# Patient Record
Sex: Female | Born: 1960 | Race: White | Hispanic: No | Smoking: Former smoker
Health system: Southern US, Community
[De-identification: ages and names within clinical notes are randomized; demographics above are authoritative.]

## PROBLEM LIST (undated history)

## (undated) DIAGNOSIS — E039 Hypothyroidism, unspecified: Secondary | ICD-10-CM

## (undated) DIAGNOSIS — T7840XA Allergy, unspecified, initial encounter: Secondary | ICD-10-CM

## (undated) DIAGNOSIS — M810 Age-related osteoporosis without current pathological fracture: Secondary | ICD-10-CM

## (undated) DIAGNOSIS — R499 Unspecified voice and resonance disorder: Secondary | ICD-10-CM

## (undated) DIAGNOSIS — I10 Essential (primary) hypertension: Secondary | ICD-10-CM

## (undated) DIAGNOSIS — F329 Major depressive disorder, single episode, unspecified: Secondary | ICD-10-CM

## (undated) DIAGNOSIS — I872 Venous insufficiency (chronic) (peripheral): Secondary | ICD-10-CM

## (undated) DIAGNOSIS — Z95828 Presence of other vascular implants and grafts: Secondary | ICD-10-CM

## (undated) DIAGNOSIS — G9050 Complex regional pain syndrome I, unspecified: Secondary | ICD-10-CM

## (undated) DIAGNOSIS — G894 Chronic pain syndrome: Secondary | ICD-10-CM

## (undated) DIAGNOSIS — R52 Pain, unspecified: Secondary | ICD-10-CM

## (undated) DIAGNOSIS — R609 Edema, unspecified: Secondary | ICD-10-CM

## (undated) DIAGNOSIS — F32A Depression, unspecified: Secondary | ICD-10-CM

## (undated) DIAGNOSIS — J301 Allergic rhinitis due to pollen: Secondary | ICD-10-CM

## (undated) DIAGNOSIS — D649 Anemia, unspecified: Secondary | ICD-10-CM

## (undated) DIAGNOSIS — R0602 Shortness of breath: Secondary | ICD-10-CM

## (undated) DIAGNOSIS — J189 Pneumonia, unspecified organism: Secondary | ICD-10-CM

## (undated) DIAGNOSIS — K5909 Other constipation: Secondary | ICD-10-CM

## (undated) DIAGNOSIS — F419 Anxiety disorder, unspecified: Secondary | ICD-10-CM

## (undated) DIAGNOSIS — M25473 Effusion, unspecified ankle: Secondary | ICD-10-CM

## (undated) DIAGNOSIS — T884XXA Failed or difficult intubation, initial encounter: Secondary | ICD-10-CM

## (undated) DIAGNOSIS — Z9109 Other allergy status, other than to drugs and biological substances: Secondary | ICD-10-CM

## (undated) DIAGNOSIS — L03116 Cellulitis of left lower limb: Secondary | ICD-10-CM

## (undated) DIAGNOSIS — M1990 Unspecified osteoarthritis, unspecified site: Secondary | ICD-10-CM

## (undated) HISTORY — PX: ORTHOPEDIC SURGERY: SHX850

## (undated) HISTORY — PX: HIP ARTHROPLASTY: SHX981

## (undated) HISTORY — DX: Allergic rhinitis due to pollen: J30.1

## (undated) HISTORY — DX: Cellulitis of left lower limb: L03.116

## (undated) HISTORY — DX: Allergy, unspecified, initial encounter: T78.40XA

## (undated) HISTORY — DX: Unspecified voice and resonance disorder: R49.9

## (undated) HISTORY — PX: SPINE SURGERY: SHX786

## (undated) HISTORY — PX: OTHER SURGICAL HISTORY: SHX169

## (undated) HISTORY — DX: Edema, unspecified: R60.9

## (undated) HISTORY — DX: Pain, unspecified: R52

## (undated) HISTORY — PX: FRACTURE SURGERY: SHX138

## (undated) HISTORY — DX: Anemia, unspecified: D64.9

## (undated) HISTORY — DX: Hypothyroidism, unspecified: E03.9

## (undated) HISTORY — DX: Unspecified osteoarthritis, unspecified site: M19.90

## (undated) HISTORY — DX: Shortness of breath: R06.02

## (undated) HISTORY — DX: Anxiety disorder, unspecified: F41.9

## (undated) HISTORY — PX: ANKLE ARTHROPLASTY: SUR68

## (undated) HISTORY — DX: Venous insufficiency (chronic) (peripheral): I87.2

## (undated) HISTORY — PX: LARYNX SURGERY: SHX692

## (undated) HISTORY — DX: Age-related osteoporosis without current pathological fracture: M81.0

## (undated) HISTORY — DX: Chronic pain syndrome: G89.4

## (undated) HISTORY — PX: PEG TUBE PLACEMENT: SUR1034

---

## 2007-10-05 DIAGNOSIS — Z95828 Presence of other vascular implants and grafts: Secondary | ICD-10-CM

## 2007-10-05 HISTORY — DX: Presence of other vascular implants and grafts: Z95.828

## 2007-10-05 HISTORY — PX: PEG TUBE PLACEMENT: SUR1034

## 2007-10-05 HISTORY — PX: IVC FILTER INSERTION: CATH118245

## 2008-04-15 ENCOUNTER — Inpatient Hospital Stay (HOSPITAL_COMMUNITY)
Admission: RE | Admit: 2008-04-15 | Discharge: 2008-04-23 | Payer: Self-pay | Admitting: Physical Medicine & Rehabilitation

## 2008-04-15 ENCOUNTER — Ambulatory Visit: Payer: Self-pay | Admitting: Physical Medicine & Rehabilitation

## 2008-05-16 ENCOUNTER — Ambulatory Visit (HOSPITAL_COMMUNITY): Admission: RE | Admit: 2008-05-16 | Discharge: 2008-05-16 | Payer: Self-pay | Admitting: Pulmonary Disease

## 2008-05-22 ENCOUNTER — Emergency Department (HOSPITAL_COMMUNITY): Admission: EM | Admit: 2008-05-22 | Discharge: 2008-05-22 | Payer: Self-pay | Admitting: Emergency Medicine

## 2008-06-24 ENCOUNTER — Ambulatory Visit (HOSPITAL_COMMUNITY)
Admission: RE | Admit: 2008-06-24 | Discharge: 2008-06-24 | Payer: Self-pay | Admitting: Physical Medicine & Rehabilitation

## 2008-08-12 ENCOUNTER — Ambulatory Visit (HOSPITAL_COMMUNITY): Admission: RE | Admit: 2008-08-12 | Discharge: 2008-08-12 | Payer: Self-pay | Admitting: Pulmonary Disease

## 2008-09-23 ENCOUNTER — Ambulatory Visit (HOSPITAL_COMMUNITY): Admission: RE | Admit: 2008-09-23 | Discharge: 2008-09-23 | Payer: Self-pay | Admitting: Pulmonary Disease

## 2008-09-25 ENCOUNTER — Ambulatory Visit (HOSPITAL_COMMUNITY): Admission: RE | Admit: 2008-09-25 | Discharge: 2008-09-25 | Payer: Self-pay | Admitting: Pulmonary Disease

## 2008-10-04 HISTORY — PX: FIBULA HARDWARE REMOVAL: SHX1629

## 2008-10-04 HISTORY — PX: KNEE ARTHROPLASTY: SHX992

## 2008-11-21 ENCOUNTER — Ambulatory Visit: Payer: Self-pay | Admitting: Internal Medicine

## 2008-11-26 ENCOUNTER — Telehealth: Payer: Self-pay | Admitting: Urgent Care

## 2009-01-16 ENCOUNTER — Telehealth (INDEPENDENT_AMBULATORY_CARE_PROVIDER_SITE_OTHER): Payer: Self-pay

## 2009-01-27 ENCOUNTER — Ambulatory Visit (HOSPITAL_COMMUNITY): Admission: RE | Admit: 2009-01-27 | Discharge: 2009-01-27 | Payer: Self-pay | Admitting: Pulmonary Disease

## 2009-01-28 DIAGNOSIS — R1319 Other dysphagia: Secondary | ICD-10-CM | POA: Insufficient documentation

## 2009-01-28 DIAGNOSIS — M25569 Pain in unspecified knee: Secondary | ICD-10-CM

## 2009-01-28 DIAGNOSIS — F3289 Other specified depressive episodes: Secondary | ICD-10-CM | POA: Insufficient documentation

## 2009-01-28 DIAGNOSIS — R0600 Dyspnea, unspecified: Secondary | ICD-10-CM

## 2009-01-28 DIAGNOSIS — F329 Major depressive disorder, single episode, unspecified: Secondary | ICD-10-CM

## 2009-01-28 DIAGNOSIS — E039 Hypothyroidism, unspecified: Secondary | ICD-10-CM | POA: Insufficient documentation

## 2009-01-28 DIAGNOSIS — R0602 Shortness of breath: Secondary | ICD-10-CM | POA: Insufficient documentation

## 2009-01-28 DIAGNOSIS — R0609 Other forms of dyspnea: Secondary | ICD-10-CM

## 2009-01-28 DIAGNOSIS — M79606 Pain in leg, unspecified: Secondary | ICD-10-CM | POA: Insufficient documentation

## 2009-01-28 HISTORY — DX: Other forms of dyspnea: R06.09

## 2009-01-28 HISTORY — DX: Dyspnea, unspecified: R06.00

## 2009-01-29 ENCOUNTER — Ambulatory Visit: Payer: Self-pay | Admitting: Internal Medicine

## 2009-01-30 ENCOUNTER — Encounter: Payer: Self-pay | Admitting: Internal Medicine

## 2009-01-31 ENCOUNTER — Ambulatory Visit: Payer: Self-pay | Admitting: Internal Medicine

## 2009-01-31 ENCOUNTER — Ambulatory Visit (HOSPITAL_COMMUNITY): Admission: RE | Admit: 2009-01-31 | Discharge: 2009-01-31 | Payer: Self-pay | Admitting: Internal Medicine

## 2009-02-03 ENCOUNTER — Encounter: Payer: Self-pay | Admitting: Internal Medicine

## 2009-02-19 ENCOUNTER — Ambulatory Visit (HOSPITAL_COMMUNITY): Admission: RE | Admit: 2009-02-19 | Discharge: 2009-02-19 | Payer: Self-pay | Admitting: Orthopedic Surgery

## 2009-04-01 ENCOUNTER — Ambulatory Visit (HOSPITAL_COMMUNITY): Admission: RE | Admit: 2009-04-01 | Discharge: 2009-04-01 | Payer: Self-pay | Admitting: Pulmonary Disease

## 2009-06-23 ENCOUNTER — Ambulatory Visit: Payer: Self-pay | Admitting: Internal Medicine

## 2009-06-23 DIAGNOSIS — K5900 Constipation, unspecified: Secondary | ICD-10-CM | POA: Insufficient documentation

## 2009-08-11 ENCOUNTER — Ambulatory Visit (HOSPITAL_COMMUNITY): Admission: RE | Admit: 2009-08-11 | Discharge: 2009-08-12 | Payer: Self-pay | Admitting: Orthopedic Surgery

## 2009-09-03 HISTORY — PX: JOINT REPLACEMENT: SHX530

## 2009-09-08 ENCOUNTER — Inpatient Hospital Stay (HOSPITAL_COMMUNITY): Admission: RE | Admit: 2009-09-08 | Discharge: 2009-09-10 | Payer: Self-pay | Admitting: Orthopedic Surgery

## 2009-12-23 ENCOUNTER — Ambulatory Visit (HOSPITAL_COMMUNITY): Admission: RE | Admit: 2009-12-23 | Discharge: 2009-12-23 | Payer: Self-pay | Admitting: Pulmonary Disease

## 2010-01-19 ENCOUNTER — Ambulatory Visit (HOSPITAL_BASED_OUTPATIENT_CLINIC_OR_DEPARTMENT_OTHER): Admission: RE | Admit: 2010-01-19 | Discharge: 2010-01-20 | Payer: Self-pay | Admitting: Orthopedic Surgery

## 2010-03-16 ENCOUNTER — Encounter: Payer: Self-pay | Admitting: Internal Medicine

## 2010-03-17 ENCOUNTER — Encounter: Payer: Self-pay | Admitting: Internal Medicine

## 2010-03-17 ENCOUNTER — Telehealth (INDEPENDENT_AMBULATORY_CARE_PROVIDER_SITE_OTHER): Payer: Self-pay

## 2010-03-25 ENCOUNTER — Other Ambulatory Visit: Admission: RE | Admit: 2010-03-25 | Discharge: 2010-03-25 | Payer: Self-pay | Admitting: Obstetrics & Gynecology

## 2010-03-26 ENCOUNTER — Ambulatory Visit (HOSPITAL_COMMUNITY): Admission: RE | Admit: 2010-03-26 | Discharge: 2010-03-26 | Payer: Self-pay | Admitting: Obstetrics & Gynecology

## 2010-04-10 ENCOUNTER — Ambulatory Visit (HOSPITAL_COMMUNITY): Admission: RE | Admit: 2010-04-10 | Discharge: 2010-04-10 | Payer: Self-pay | Admitting: Internal Medicine

## 2010-04-10 ENCOUNTER — Ambulatory Visit: Payer: Self-pay | Admitting: Internal Medicine

## 2010-06-05 ENCOUNTER — Ambulatory Visit (HOSPITAL_COMMUNITY)
Admission: RE | Admit: 2010-06-05 | Discharge: 2010-06-05 | Payer: Self-pay | Source: Home / Self Care | Admitting: Pulmonary Disease

## 2010-06-15 ENCOUNTER — Ambulatory Visit (HOSPITAL_COMMUNITY): Admission: RE | Admit: 2010-06-15 | Discharge: 2010-06-15 | Payer: Self-pay | Admitting: Pulmonary Disease

## 2010-09-03 ENCOUNTER — Ambulatory Visit (HOSPITAL_COMMUNITY): Admission: RE | Admit: 2010-09-03 | Discharge: 2010-09-03 | Payer: Self-pay | Admitting: Pulmonary Disease

## 2010-10-04 HISTORY — PX: ANKLE HARDWARE REMOVAL: SHX1149

## 2010-10-30 ENCOUNTER — Ambulatory Visit (HOSPITAL_COMMUNITY)
Admission: RE | Admit: 2010-10-30 | Discharge: 2010-10-30 | Payer: Self-pay | Source: Home / Self Care | Attending: Pulmonary Disease | Admitting: Pulmonary Disease

## 2010-11-05 NOTE — Progress Notes (Signed)
----   Converted from flag ---- ---- 03/17/2010 8:54 AM, Jonathon Bellows MD, Caleen Essex wrote: no; not for tcs  ---- 03/16/2010 4:04 PM, Cloria Spring LPN wrote: Dr. Jena Gauss, please review triage. Pt had left knee replacement in 09/2009.  Is required to have antibiotics for dental procedures. Will she need antibiotics for the TCS? ------------------------------

## 2010-11-05 NOTE — Letter (Signed)
Summary: TRIAGE ORDER  TRIAGE ORDER   Imported By: Ave Filter 03/17/2010 12:19:53  _____________________________________________________________________  External Attachment:    Type:   Image     Comment:   External Document

## 2010-11-05 NOTE — Letter (Signed)
Summary: TRIAGE ORDER  TRIAGE ORDER   Imported By: Ave Filter 03/16/2010 14:03:18  _____________________________________________________________________  External Attachment:    Type:   Image     Comment:   External Document

## 2011-01-05 LAB — BASIC METABOLIC PANEL
BUN: 5 mg/dL — ABNORMAL LOW (ref 6–23)
BUN: 9 mg/dL (ref 6–23)
CO2: 27 mEq/L (ref 19–32)
CO2: 29 mEq/L (ref 19–32)
Calcium: 8.3 mg/dL — ABNORMAL LOW (ref 8.4–10.5)
Chloride: 102 mEq/L (ref 96–112)
Chloride: 99 mEq/L (ref 96–112)
Creatinine, Ser: 0.71 mg/dL (ref 0.4–1.2)
GFR calc non Af Amer: >60 mL/min (ref >60)
Glucose, Bld: 112 mg/dL — ABNORMAL HIGH (ref 70–99)
Glucose, Bld: 135 mg/dL — ABNORMAL HIGH (ref 70–99)
Potassium: 4.1 mEq/L (ref 3.5–5.1)
Sodium: 131 mEq/L — ABNORMAL LOW (ref 135–145)

## 2011-01-05 LAB — CBC
HCT: 24.8 % — ABNORMAL LOW (ref 36.0–46.0)
Hemoglobin: 13.9 g/dL (ref 12.0–15.0)
Hemoglobin: 8.5 g/dL — ABNORMAL LOW (ref 12.0–15.0)
MCHC: 34 g/dL (ref 30.0–36.0)
MCHC: 34.3 g/dL (ref 30.0–36.0)
MCHC: 34.3 g/dL (ref 30.0–36.0)
MCV: 87.1 fL (ref 78.0–100.0)
MCV: 87.4 fL (ref 78.0–100.0)
Platelets: 100 10*3/uL — ABNORMAL LOW (ref 150–400)
RBC: 4.65 MIL/uL (ref 3.87–5.11)
RDW: 12.2 % (ref 11.5–15.5)
RDW: 12.4 % (ref 11.5–15.5)

## 2011-01-05 LAB — URINALYSIS, ROUTINE W REFLEX MICROSCOPIC
Glucose, UA: NEGATIVE mg/dL
Ketones, ur: NEGATIVE mg/dL
Protein, ur: NEGATIVE mg/dL
Urobilinogen, UA: 0.2 mg/dL (ref 0.0–1.0)

## 2011-01-05 LAB — DIFFERENTIAL
Eosinophils Absolute: 0.2 10*3/uL (ref 0.0–0.7)
Eosinophils Relative: 5 % (ref 0–5)
Lymphs Abs: 2.1 10*3/uL (ref 0.7–4.0)

## 2011-01-05 LAB — TYPE AND SCREEN: ABO/RH(D): B POS

## 2011-01-06 LAB — BASIC METABOLIC PANEL
BUN: 9 mg/dL (ref 6–23)
Calcium: 8.6 mg/dL (ref 8.4–10.5)
Calcium: 9.5 mg/dL (ref 8.4–10.5)
GFR calc Af Amer: >60 mL/min (ref >60)
GFR calc non Af Amer: >60 mL/min (ref >60)
GFR calc non Af Amer: >60 mL/min (ref >60)
Potassium: 4.2 mEq/L (ref 3.5–5.1)
Sodium: 137 mEq/L (ref 135–145)
Sodium: 139 mEq/L (ref 135–145)

## 2011-01-06 LAB — CBC
HCT: 38.5 % (ref 36.0–46.0)
Hemoglobin: 12.3 g/dL (ref 12.0–15.0)
Hemoglobin: 13.2 g/dL (ref 12.0–15.0)
Platelets: 147 10*3/uL — ABNORMAL LOW (ref 150–400)
RBC: 4.01 MIL/uL (ref 3.87–5.11)
RDW: 12.5 % (ref 11.5–15.5)
WBC: 4.6 10*3/uL (ref 4.0–10.5)
WBC: 6.9 10*3/uL (ref 4.0–10.5)

## 2011-01-06 LAB — DIFFERENTIAL
Basophils Absolute: 0 10*3/uL (ref 0.0–0.1)
Eosinophils Relative: 7 % — ABNORMAL HIGH (ref 0–5)
Lymphocytes Relative: 51 % — ABNORMAL HIGH (ref 12–46)
Lymphs Abs: 2.3 10*3/uL (ref 0.7–4.0)
Neutro Abs: 1.6 10*3/uL — ABNORMAL LOW (ref 1.7–7.7)

## 2011-01-06 LAB — APTT: aPTT: 32 seconds (ref 24–37)

## 2011-01-06 LAB — URINALYSIS, ROUTINE W REFLEX MICROSCOPIC
Bilirubin Urine: NEGATIVE
Glucose, UA: NEGATIVE mg/dL

## 2011-01-06 LAB — TYPE AND SCREEN: Antibody Screen: NEGATIVE

## 2011-01-06 LAB — PROTIME-INR: Prothrombin Time: 13.8 seconds (ref 11.6–15.2)

## 2011-01-12 LAB — COMPREHENSIVE METABOLIC PANEL
ALT: 14 U/L (ref 0–35)
CO2: 35 mEq/L — ABNORMAL HIGH (ref 19–32)
Calcium: 9.3 mg/dL (ref 8.4–10.5)
GFR calc non Af Amer: >60 mL/min (ref >60)
Glucose, Bld: 104 mg/dL — ABNORMAL HIGH (ref 70–99)
Sodium: 142 mEq/L (ref 135–145)

## 2011-01-12 LAB — CBC
Hemoglobin: 13.3 g/dL (ref 12.0–15.0)
MCHC: 34.2 g/dL (ref 30.0–36.0)
MCV: 86.1 fL (ref 78.0–100.0)
RBC: 4.51 MIL/uL (ref 3.87–5.11)
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
## 2011-02-16 NOTE — Op Note (Signed)
Stephanie Elliott, RUEDAS           ACCOUNT NO.:  [id number]   MEDICAL RECORD NO.:  [id number]          PATIENT TYPE:  AMB   LOCATION:  DAY                          FACILITY[facility]  [facility]   PHYSICIAN:  Ronald A. Gioffre, M.D.DATE OF BIRTH:  1961-01-18   DATE OF PROCEDURE:  02/19/2009  DATE OF DISCHARGE:                               OPERATIVE REPORT   ADDENDUM   OPERATION:  Also abrasion chondroplasty of the medial femoral condyle.  I also did abrasion chondroplasty of the patella.   OPERATIVE NOTE:  I did perform an abrasion chondroplasty of the medial  femoral condyle utilizing the shaver suction device.           ______________________________  Georges Lynch Darrelyn Hillock, M.D.     RAG/MEDQ  D:  02/19/2009  T:  02/19/2009  Job:  [id number]
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
## 2011-04-08 ENCOUNTER — Ambulatory Visit (HOSPITAL_COMMUNITY)
Admission: RE | Admit: 2011-04-08 | Discharge: 2011-04-08 | Disposition: A | Discharge status: Home / Self Care | Payer: Medicare Other | Source: Ambulatory Visit | Attending: Pulmonary Disease | Admitting: Pulmonary Disease

## 2011-04-08 ENCOUNTER — Other Ambulatory Visit (HOSPITAL_COMMUNITY): Payer: Self-pay | Admitting: Pulmonary Disease

## 2011-04-08 DIAGNOSIS — M25531 Pain in right wrist: Secondary | ICD-10-CM

## 2011-04-08 DIAGNOSIS — M25539 Pain in unspecified wrist: Secondary | ICD-10-CM | POA: Insufficient documentation

## 2011-07-01 LAB — URINALYSIS, ROUTINE W REFLEX MICROSCOPIC
Ketones, ur: NEGATIVE
Nitrite: NEGATIVE
Protein, ur: NEGATIVE
Urobilinogen, UA: 1
pH: 7.5

## 2011-07-01 LAB — URINE CULTURE: Colony Count: >=100000

## 2011-07-01 LAB — COMPREHENSIVE METABOLIC PANEL WITH GFR
ALT: 11
AST: 15
Albumin: 3.4 — ABNORMAL LOW
Alkaline Phosphatase: 60
BUN: 17
CO2: 33 — ABNORMAL HIGH
Calcium: 9.5
Chloride: 104
Creatinine, Ser: 0.79
GFR calc non Af Amer: >60
Glucose, Bld: 103 — ABNORMAL HIGH
Potassium: 4
Sodium: 140
Total Bilirubin: 0.6
Total Protein: 6.2

## 2011-07-01 LAB — URINE MICROSCOPIC-ADD ON

## 2011-07-01 LAB — CBC
RBC: 3.56 — ABNORMAL LOW
WBC: 4.8

## 2011-07-02 LAB — URINALYSIS, ROUTINE W REFLEX MICROSCOPIC
Hgb urine dipstick: NEGATIVE
Specific Gravity, Urine: 1.009
Urobilinogen, UA: 0.2
pH: 7.5

## 2011-07-02 LAB — URINE CULTURE

## 2011-07-02 LAB — URINE MICROSCOPIC-ADD ON

## 2011-08-20 ENCOUNTER — Other Ambulatory Visit (HOSPITAL_COMMUNITY): Payer: Self-pay | Admitting: Pulmonary Disease

## 2011-08-24 ENCOUNTER — Ambulatory Visit (HOSPITAL_COMMUNITY)
Admission: RE | Admit: 2011-08-24 | Discharge: 2011-08-24 | Disposition: A | Discharge status: Home / Self Care | Payer: Medicare Other | Source: Ambulatory Visit | Attending: Pulmonary Disease | Admitting: Pulmonary Disease

## 2011-08-24 DIAGNOSIS — R1012 Left upper quadrant pain: Secondary | ICD-10-CM | POA: Insufficient documentation

## 2011-08-24 DIAGNOSIS — D3000 Benign neoplasm of unspecified kidney: Secondary | ICD-10-CM | POA: Insufficient documentation

## 2011-08-24 MED ORDER — IOHEXOL 300 MG/ML  SOLN
100.0000 mL | Freq: Once | INTRAMUSCULAR | Status: AC | PRN
  Administered 2011-08-24: 100 mL via INTRAVENOUS

## 2011-10-06 DIAGNOSIS — R490 Dysphonia: Secondary | ICD-10-CM | POA: Insufficient documentation

## 2011-10-06 DIAGNOSIS — J3802 Paralysis of vocal cords and larynx, bilateral: Secondary | ICD-10-CM | POA: Insufficient documentation

## 2011-10-06 DIAGNOSIS — E039 Hypothyroidism, unspecified: Secondary | ICD-10-CM | POA: Insufficient documentation

## 2011-12-02 DIAGNOSIS — Z79891 Long term (current) use of opiate analgesic: Secondary | ICD-10-CM | POA: Insufficient documentation

## 2011-12-02 DIAGNOSIS — Z5181 Encounter for therapeutic drug level monitoring: Secondary | ICD-10-CM | POA: Insufficient documentation

## 2012-02-21 ENCOUNTER — Encounter: Payer: Self-pay | Admitting: Family Medicine

## 2012-04-12 ENCOUNTER — Encounter: Payer: Self-pay | Admitting: Family Medicine

## 2012-05-25 ENCOUNTER — Other Ambulatory Visit (HOSPITAL_COMMUNITY): Payer: Self-pay | Admitting: Pulmonary Disease

## 2012-05-25 DIAGNOSIS — Z139 Encounter for screening, unspecified: Secondary | ICD-10-CM

## 2012-05-30 ENCOUNTER — Ambulatory Visit (HOSPITAL_COMMUNITY)
Admission: RE | Admit: 2012-05-30 | Discharge: 2012-05-30 | Disposition: A | Discharge status: Home / Self Care | Payer: Medicare Other | Source: Ambulatory Visit | Attending: Pulmonary Disease | Admitting: Pulmonary Disease

## 2012-05-30 DIAGNOSIS — Z1231 Encounter for screening mammogram for malignant neoplasm of breast: Secondary | ICD-10-CM | POA: Insufficient documentation

## 2012-05-30 DIAGNOSIS — Z139 Encounter for screening, unspecified: Secondary | ICD-10-CM

## 2012-06-02 LAB — HM MAMMOGRAPHY

## 2012-12-08 ENCOUNTER — Emergency Department (HOSPITAL_COMMUNITY)
Admission: EM | Admit: 2012-12-08 | Discharge: 2012-12-08 | Disposition: A | Discharge status: Home / Self Care | Payer: Medicare Other | Attending: Emergency Medicine | Admitting: Emergency Medicine

## 2012-12-08 ENCOUNTER — Encounter (HOSPITAL_COMMUNITY): Payer: Self-pay | Admitting: *Deleted

## 2012-12-08 DIAGNOSIS — Z79899 Other long term (current) drug therapy: Secondary | ICD-10-CM | POA: Insufficient documentation

## 2012-12-08 DIAGNOSIS — G8921 Chronic pain due to trauma: Secondary | ICD-10-CM | POA: Insufficient documentation

## 2012-12-08 HISTORY — DX: Complex regional pain syndrome I, unspecified: G90.50

## 2012-12-08 MED ORDER — OXYCODONE-ACETAMINOPHEN 10-325 MG PO TABS: 1.0000 | ORAL_TABLET | Freq: Four times a day (QID) | ORAL | Status: DC | PRN
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
## 2012-12-08 NOTE — ED Notes (Signed)
Patient with no complaints at this time. Respirations even and unlabored. Skin warm/dry. Discharge instructions reviewed with patient at this time. Patient given opportunity to voice concerns/ask questions. Patient discharged at this time and left Emergency Department with steady gait.   

## 2012-12-08 NOTE — ED Notes (Signed)
States she is out of pain meds  due to the weather, her physician is out of the office.

## 2012-12-17 NOTE — ED Provider Notes (Signed)
Medical screening examination/treatment/procedure(s) were performed by non-physician practitioner and as supervising physician I was immediately available for consultation/collaboration.   Benny Lennert, MD 12/17/12 [phone]

## 2012-12-19 ENCOUNTER — Other Ambulatory Visit (HOSPITAL_COMMUNITY): Payer: Self-pay | Admitting: Pulmonary Disease

## 2012-12-19 DIAGNOSIS — R609 Edema, unspecified: Secondary | ICD-10-CM

## 2012-12-19 DIAGNOSIS — R52 Pain, unspecified: Secondary | ICD-10-CM

## 2012-12-20 ENCOUNTER — Ambulatory Visit (HOSPITAL_COMMUNITY)
Admission: RE | Admit: 2012-12-20 | Discharge: 2012-12-20 | Disposition: A | Discharge status: Home / Self Care | Payer: Medicare Other | Source: Ambulatory Visit | Attending: Pulmonary Disease | Admitting: Pulmonary Disease

## 2012-12-20 DIAGNOSIS — R52 Pain, unspecified: Secondary | ICD-10-CM

## 2012-12-20 DIAGNOSIS — M7989 Other specified soft tissue disorders: Secondary | ICD-10-CM | POA: Insufficient documentation

## 2012-12-20 DIAGNOSIS — M79609 Pain in unspecified limb: Secondary | ICD-10-CM | POA: Insufficient documentation

## 2012-12-20 DIAGNOSIS — R609 Edema, unspecified: Secondary | ICD-10-CM

## 2013-02-15 ENCOUNTER — Ambulatory Visit (HOSPITAL_COMMUNITY)
Admission: RE | Admit: 2013-02-15 | Discharge: 2013-02-15 | Disposition: A | Discharge status: Home / Self Care | Payer: Medicare Other | Source: Ambulatory Visit | Attending: Pulmonary Disease | Admitting: Pulmonary Disease

## 2013-02-15 ENCOUNTER — Other Ambulatory Visit (HOSPITAL_COMMUNITY): Payer: Self-pay | Admitting: Pulmonary Disease

## 2013-02-15 DIAGNOSIS — R609 Edema, unspecified: Secondary | ICD-10-CM

## 2013-02-15 DIAGNOSIS — R52 Pain, unspecified: Secondary | ICD-10-CM

## 2013-02-15 DIAGNOSIS — M7989 Other specified soft tissue disorders: Secondary | ICD-10-CM | POA: Insufficient documentation

## 2013-02-15 DIAGNOSIS — M79609 Pain in unspecified limb: Secondary | ICD-10-CM | POA: Insufficient documentation

## 2013-05-04 ENCOUNTER — Encounter (HOSPITAL_COMMUNITY): Payer: Self-pay | Admitting: Pharmacy Technician

## 2013-05-12 NOTE — Pre-Procedure Instructions (Signed)
Stephanie Elliott  05/12/2013   Your procedure is scheduled on:  August 14  Report to [facility] at 08:30 AM.  Call this number if you have problems the morning of surgery: [phone]   Remember:     Do not eat food or drink liquids after midnight.   Take these medicines the morning of surgery with A SIP OF WATER: Adderall (if needed), Cymbalta, Levothyroxine, Oxycodone, Doxepin, Claritin     STOP Multiple Vitamins today 05/14/13  Do not wear jewelry, make-up or nail polish.  Do not wear lotions, powders, or perfumes. You may wear deodorant.  Do not shave 48 hours prior to surgery. Men may shave face and neck.  Do not bring valuables to the hospital.  [facility] is not responsible for any belongings or valuables.  Contacts, dentures or bridgework may not be worn into surgery.  Leave suitcase in the car. After surgery it may be brought to your room.  For patients admitted to the hospital, checkout time is 11:00 AM the day of discharge.   Special Instructions: Shower using CHG 2 nights before surgery and the night before surgery.  If you shower the day of surgery use CHG.  Use special wash - you have one bottle of CHG for all showers.  You should use approximately 1/3 of the bottle for each shower.   Please read over the following fact sheets that you were given: Pain Booklet, Coughing and Deep Breathing, Blood Transfusion Information, Total Joint Packet, MRSA Information and Surgical Site Infection Prevention

## 2013-05-14 ENCOUNTER — Encounter (HOSPITAL_COMMUNITY): Payer: Self-pay

## 2013-05-14 ENCOUNTER — Encounter (HOSPITAL_COMMUNITY)
Admission: RE | Admit: 2013-05-14 | Discharge: 2013-05-14 | Disposition: A | Discharge status: Home / Self Care | Payer: Medicare Other | Source: Ambulatory Visit | Attending: Orthopedic Surgery | Admitting: Orthopedic Surgery

## 2013-05-14 DIAGNOSIS — Z01818 Encounter for other preprocedural examination: Secondary | ICD-10-CM | POA: Insufficient documentation

## 2013-05-14 DIAGNOSIS — Z01812 Encounter for preprocedural laboratory examination: Secondary | ICD-10-CM | POA: Insufficient documentation

## 2013-05-14 HISTORY — DX: Pneumonia, unspecified organism: J18.9

## 2013-05-14 HISTORY — DX: Major depressive disorder, single episode, unspecified: F32.9

## 2013-05-14 HISTORY — DX: Failed or difficult intubation, initial encounter: T88.4XXA

## 2013-05-14 HISTORY — DX: Presence of other vascular implants and grafts: Z95.828

## 2013-05-14 HISTORY — DX: Effusion, unspecified ankle: M25.473

## 2013-05-14 HISTORY — DX: Depression, unspecified: F32.A

## 2013-05-14 HISTORY — DX: Hypothyroidism, unspecified: E03.9

## 2013-05-14 HISTORY — DX: Other allergy status, other than to drugs and biological substances: Z91.09

## 2013-05-14 HISTORY — DX: Other constipation: K59.09

## 2013-05-14 LAB — CBC
HCT: 39.3 % (ref 36.0–46.0)
Hemoglobin: 14 g/dL (ref 12.0–15.0)
MCH: 29.9 pg (ref 26.0–34.0)
RBC: 4.69 MIL/uL (ref 3.87–5.11)

## 2013-05-14 LAB — SURGICAL PCR SCREEN
MRSA, PCR: NEGATIVE
Staphylococcus aureus: NEGATIVE

## 2013-05-14 LAB — BASIC METABOLIC PANEL
Calcium: 9.5 mg/dL (ref 8.4–10.5)
Chloride: 102 mEq/L (ref 96–112)
Creatinine, Ser: 0.64 mg/dL (ref 0.50–1.10)
GFR calc Af Amer: >90 mL/min (ref >90)
Sodium: 139 mEq/L (ref 135–145)

## 2013-05-14 LAB — TYPE AND SCREEN
ABO/RH(D): B POS
Antibody Screen: NEGATIVE

## 2013-05-14 LAB — ABO/RH: ABO/RH(D): B POS
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
## 2013-05-14 NOTE — H&P (Signed)
istory of Present Illness  The patient is a 52 year old female who presents today for follow up of their back. The patient is being followed for their central back pain. Symptoms reported today include: pain (lower lumbar radiating into bilat. lower ext. with left greater than right ), pain at night, aching, throbbing, stiffness, weakness (bilat. lower ext. ) and numbness (left lower ext. to the level of the foot ), while the patient does not report symptoms of: urinary incontinence. The following medication has been used for pain control: Oxycontin 30mg , Cymbalta 30mg . The patient presents today following ESI (left L3-4 performed on 052014 per Dr. Ethelene Hal and patient reports relief lasted about 6 weeks, also another ESI done on 04/18/2013 per Dr. Ethelene Hal which patient states is still working and giving her relief). The patient has reported improvement of their symptoms with: Cortisone injections (decreased pain since injection ).    Subjective Transcription  She returns today for follow up. At this point in time, she continues to have significant disabling back pain. She did note that the last two L3-4 transforaminal epidural injections were very satisfied and were significant in temporarily helping her pain, much moreso than the previous L5-S1 epidural steroid injections. At this point, despite the therapy, the injections, and the conservative management, her quality of life continues to suffer. As a result, she has expressed a desire to proceed with surgery.    Allergies CODEINE. 02/05/2009   Social History Alcohol use. current drinker; drinks beer; only occasionally per week Children. 2 Current work status. disabled Drug/Alcohol Rehab (Currently). no Drug/Alcohol Rehab (Previously). no Exercise. Exercises weekly; does running / walking Illicit drug use. no Living situation. live with parents Marital status. divorced Number of flights of stairs before winded.  2-3 Pain Contract. no Tobacco use. current some days smoker; smoke(d) less than 1/2 pack(s) per day   Medication History CeleBREX (200MG  Capsule, 1 (one) Capsule Oral PO BID WITH FOOD, Taken starting 07/27/2012) Active. (PAB/JAF RX SENT VIA ESCRIBE) Adderall ( Oral) Specific dose unknown - Active. Amphetamine-Dextroamphetamine (20MG  Tablet, Oral two times daily) Active. Doxycycline Monohydrate (100MG  Tablet, Oral two times daily) Active. OxyCONTIN (30MG  Tablet ER 12HR, Oral three times daily) Active. Cymbalta (30MG  Capsule DR Part, Oral daily) Active. Levothyroxine Sodium ( Tablet, Oral daily) Active. Amoxicillin ( Oral) Specific dose unknown - Active.   Objective Transcription  On clinical exam, she's a pleasant woman, who appears younger than her stated age. She's alert. She's oriented times 3. She ambulates with a cane from her previous acetabular fracture that was fixed. She has significant back pain with palpation and ROM. She has no shortness of breath or chest pain. The abdomen is soft and nontender. Intact peripheral pulses. She has left hip pain with ROM and palpation which is chronic. No knee or ankle pain. Compartments are soft and nontender. Negative Babinski test. No clonus. She has a well-healed surgical scar in the lumbar spine. No focal motor deficits. She does have an altered gait pattern due to a tibia fracture and other extremity trauma.    Assessment & Plan Chronic pain syndrome (338.4) Current Plans l Risks of surgery include, but are not limited to: Death, stroke, paralysis, nerve root damage/injury, bleeding, blood clots, loss of bowel/bladder control, sexual dysfunction, retrograde ejaculation, hardware failure, or malposition, spinal fluid leak, adjacent segment disease, non-union, need for further surgery, ongoing or worse pain, injury to bladder, bowel and abdominal contents, infection and recurrent disc herniation  l We  have gone over the  risks and benefits of surgery, which include infection, bleeding, nerve damage, death, stroke, paralysis, failure to heal, need for further surgery, ongoing or worse pain, loss of fixation, need for further surgery, CSF leak, loss of bowel or bladder control, ongoing or worse pain.  l Pt Education - How to access health information online: discussed with patient and provided information.  Lower leg pain (729.5)   Plans Transcription  At this point in time, given the patient's success with the injection therapy and the loss in quality of life, she would like to proceed with surgery. I think this is reasonable. Her new MRI is consistent with her previous one. She does have, on x-ray, a degenerative scoliosis. The 3-4 level has a relatively well spared disc. There is a compensatory thoracic curve to her lumbar curve. At this point, I am reluctant to recommend treating the entire idiopathic adult scoliosis, as this would be a very extensive operation, and I don't think it would address her primary pain source. She has significant collapse at L5-S1. She has a Grade 1 spondylolisthesis at L4-5 with DDD and degenerative spinal stenosis and at L3-4 she has degenerative spinal stenosis with no instability.    At this point, I think the best course of action, given her history, is the following: One would be to do a decompression to address her spinal stenosis at 3-4 and 4-5. Because there is already an instability pattern at 4-5, I have great concern that either the scoliosis or the spondylolisthesis would intensify with just the decompression alone, therefore I would most likely recommend an interbody fixation and pedicle screw fixation at 4-5. This, in turn, would increase the stress transfer to the degenerated L5-S1 level, therefore I would recommend with extending the fusion down to S1 so that we decrease the likelihood of iatrogenic L5-S1 discogenic pain coming from  the adjacent fused level. The 3-4 level is an adequate disc, and I think it has a good chance of maintaining its health and not breaking down. I did tell her that the scoliosis will persist. There is a risk that she will not be better or worse from the surgery, but I think by addressing the principal problems with an L4 to S1 instrumented fusion and L4-5 T-LIF with an L3-4 and 4-5 decompression, this is the best way I have of doing this. I have explained the risks to her which include infection, bleeding, nerve damage, death, stroke, paralysis, failure of heal, need for further surgery, ongoing or worse pain, loss of bowel and bladder control, need for supplemental fusion surgery, and she has expressed an understanding of the surgery and the risks. Because this is a multi-level procedure and she's had previous lumbar surgery, I would also request that we use a postoperative external bone stimulator to improve our change of obtaining a solid fusion. We will go ahead and get preoperative medical clearance and, once we have that and ensurance, we will go ahead and proceed with the surgery.
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
## 2013-05-16 MED ORDER — ACETAMINOPHEN 10 MG/ML IV SOLN
1000.0000 mg | Freq: Four times a day (QID) | INTRAVENOUS | Status: DC
  Administered 2013-05-17: 1000 mg via INTRAVENOUS
  Filled 2013-05-16: qty 100

## 2013-05-16 MED ORDER — DEXAMETHASONE SODIUM PHOSPHATE 4 MG/ML IJ SOLN
4.0000 mg | Freq: Once | INTRAMUSCULAR | Status: AC
  Administered 2013-05-17: 4 mg via INTRAVENOUS
  Filled 2013-05-16: qty 1

## 2013-05-16 MED ORDER — CEFAZOLIN SODIUM-DEXTROSE 2-3 GM-% IV SOLR
2.0000 g | INTRAVENOUS | Status: AC
  Administered 2013-05-17 (×2): 2 g via INTRAVENOUS
  Filled 2013-05-16: qty 50

## 2013-05-17 ENCOUNTER — Inpatient Hospital Stay (HOSPITAL_COMMUNITY): Payer: Medicare Other

## 2013-05-17 ENCOUNTER — Encounter (HOSPITAL_COMMUNITY): Payer: Self-pay | Admitting: *Deleted

## 2013-05-17 ENCOUNTER — Encounter (HOSPITAL_COMMUNITY)
Admission: RE | Disposition: A | Discharge status: Home Health | Payer: Self-pay | Source: Ambulatory Visit | Attending: Orthopedic Surgery

## 2013-05-17 ENCOUNTER — Encounter (HOSPITAL_COMMUNITY): Payer: Self-pay | Admitting: Vascular Surgery

## 2013-05-17 ENCOUNTER — Inpatient Hospital Stay (HOSPITAL_COMMUNITY): Payer: Medicare Other | Admitting: Anesthesiology

## 2013-05-17 ENCOUNTER — Inpatient Hospital Stay (HOSPITAL_COMMUNITY)
Admission: RE | Admit: 2013-05-17 | Discharge: 2013-05-21 | DRG: 459 | Disposition: A | Discharge status: Home Health | Payer: Medicare Other | Source: Ambulatory Visit | Attending: Orthopedic Surgery | Admitting: Orthopedic Surgery

## 2013-05-17 DIAGNOSIS — IMO0001 Reserved for inherently not codable concepts without codable children: Secondary | ICD-10-CM

## 2013-05-17 DIAGNOSIS — S1983XS Other specified injuries of vocal cord, sequela: Secondary | ICD-10-CM

## 2013-05-17 DIAGNOSIS — E039 Hypothyroidism, unspecified: Secondary | ICD-10-CM | POA: Diagnosis present

## 2013-05-17 DIAGNOSIS — M4120 Other idiopathic scoliosis, site unspecified: Secondary | ICD-10-CM | POA: Diagnosis present

## 2013-05-17 DIAGNOSIS — J398 Other specified diseases of upper respiratory tract: Secondary | ICD-10-CM | POA: Diagnosis present

## 2013-05-17 DIAGNOSIS — F3289 Other specified depressive episodes: Secondary | ICD-10-CM | POA: Diagnosis present

## 2013-05-17 DIAGNOSIS — M48061 Spinal stenosis, lumbar region without neurogenic claudication: Principal | ICD-10-CM | POA: Diagnosis present

## 2013-05-17 DIAGNOSIS — Q762 Congenital spondylolisthesis: Secondary | ICD-10-CM

## 2013-05-17 DIAGNOSIS — K5900 Constipation, unspecified: Secondary | ICD-10-CM | POA: Diagnosis present

## 2013-05-17 DIAGNOSIS — G589 Mononeuropathy, unspecified: Secondary | ICD-10-CM | POA: Diagnosis present

## 2013-05-17 DIAGNOSIS — S1983XA Other specified injuries of vocal cord, initial encounter: Secondary | ICD-10-CM

## 2013-05-17 DIAGNOSIS — F329 Major depressive disorder, single episode, unspecified: Secondary | ICD-10-CM | POA: Diagnosis present

## 2013-05-17 DIAGNOSIS — J988 Other specified respiratory disorders: Secondary | ICD-10-CM | POA: Diagnosis present

## 2013-05-17 DIAGNOSIS — D62 Acute posthemorrhagic anemia: Secondary | ICD-10-CM | POA: Diagnosis present

## 2013-05-17 DIAGNOSIS — J95821 Acute postprocedural respiratory failure: Secondary | ICD-10-CM | POA: Diagnosis not present

## 2013-05-17 DIAGNOSIS — J9600 Acute respiratory failure, unspecified whether with hypoxia or hypercapnia: Secondary | ICD-10-CM | POA: Diagnosis not present

## 2013-05-17 HISTORY — PX: SPINAL FUSION: SHX223

## 2013-05-17 LAB — GLUCOSE, CAPILLARY: Glucose-Capillary: 124 mg/dL — ABNORMAL HIGH (ref 70–99)

## 2013-05-17 LAB — BASIC METABOLIC PANEL
BUN: 8 mg/dL (ref 6–23)
CO2: 26 mEq/L (ref 19–32)
Chloride: 105 mEq/L (ref 96–112)
GFR calc Af Amer: >90 mL/min (ref >90)
Glucose, Bld: 175 mg/dL — ABNORMAL HIGH (ref 70–99)
Potassium: 3.8 mEq/L (ref 3.5–5.1)

## 2013-05-17 LAB — CBC
HCT: 33.3 % — ABNORMAL LOW (ref 36.0–46.0)
Hemoglobin: 11.7 g/dL — ABNORMAL LOW (ref 12.0–15.0)
MCH: 28.8 pg (ref 26.0–34.0)
MCHC: 35.1 g/dL (ref 30.0–36.0)

## 2013-05-17 SURGERY — FUSION, SPINE, 2 OR MORE LEVELS, POSTERIOR APPROACH
Anesthesia: General | Site: Spine Lumbar | Wound class: Clean

## 2013-05-17 MED ORDER — ACETAMINOPHEN 10 MG/ML IV SOLN
1000.0000 mg | Freq: Four times a day (QID) | INTRAVENOUS | Status: AC
  Administered 2013-05-17 – 2013-05-18 (×4): 1000 mg via INTRAVENOUS
  Filled 2013-05-17 (×4): qty 100

## 2013-05-17 MED ORDER — ONDANSETRON HCL 4 MG/2ML IJ SOLN: 4.0000 mg | INTRAMUSCULAR | Status: DC | PRN

## 2013-05-17 MED ORDER — LIDOCAINE HCL (CARDIAC) 20 MG/ML IV SOLN
INTRAVENOUS | Status: DC | PRN
  Administered 2013-05-17: 50 mg via INTRAVENOUS

## 2013-05-17 MED ORDER — INSULIN ASPART 100 UNIT/ML ~~LOC~~ SOLN
0.0000 [IU] | SUBCUTANEOUS | Status: DC
  Administered 2013-05-17 (×2): 1 [IU] via SUBCUTANEOUS

## 2013-05-17 MED ORDER — PHENYLEPHRINE HCL 10 MG/ML IJ SOLN
10.0000 mg | INTRAVENOUS | Status: DC | PRN
  Administered 2013-05-17: 10 ug/min via INTRAVENOUS

## 2013-05-17 MED ORDER — DEXMEDETOMIDINE HCL IN NACL 400 MCG/100ML IV SOLN
0.4000 ug/kg/h | INTRAVENOUS | Status: DC
  Administered 2013-05-17: 17:00:00 via INTRAVENOUS
  Administered 2013-05-17: 0.5 ug/kg/h via INTRAVENOUS
  Filled 2013-05-17 (×2): qty 100

## 2013-05-17 MED ORDER — THROMBIN 20000 UNITS EX SOLR
CUTANEOUS | Status: AC
  Filled 2013-05-17: qty 20000

## 2013-05-17 MED ORDER — DEXAMETHASONE 4 MG PO TABS
4.0000 mg | ORAL_TABLET | Freq: Four times a day (QID) | ORAL | Status: DC
  Administered 2013-05-20 – 2013-05-21 (×4): 4 mg via ORAL
  Filled 2013-05-17 (×19): qty 1

## 2013-05-17 MED ORDER — ALBUMIN HUMAN 5 % IV SOLN
INTRAVENOUS | Status: DC | PRN
  Administered 2013-05-17: 12:00:00 via INTRAVENOUS

## 2013-05-17 MED ORDER — CEFAZOLIN SODIUM 1-5 GM-% IV SOLN
1.0000 g | Freq: Three times a day (TID) | INTRAVENOUS | Status: AC
  Administered 2013-05-17 – 2013-05-18 (×2): 1 g via INTRAVENOUS
  Filled 2013-05-17 (×2): qty 50

## 2013-05-17 MED ORDER — LACTATED RINGERS IV SOLN
INTRAVENOUS | Status: DC
  Administered 2013-05-17: 10:00:00 via INTRAVENOUS

## 2013-05-17 MED ORDER — LEVOTHYROXINE SODIUM 150 MCG PO TABS: 150.0000 ug | ORAL_TABLET | Freq: Every day | ORAL | Status: DC

## 2013-05-17 MED ORDER — PHENYLEPHRINE HCL 10 MG/ML IJ SOLN
INTRAMUSCULAR | Status: DC | PRN
  Administered 2013-05-17 (×2): 20 ug via INTRAVENOUS
  Administered 2013-05-17 (×2): 40 ug via INTRAVENOUS
  Administered 2013-05-17: 20 ug via INTRAVENOUS

## 2013-05-17 MED ORDER — ZOLPIDEM TARTRATE 5 MG PO TABS: 5.0000 mg | ORAL_TABLET | Freq: Every evening | ORAL | Status: DC | PRN

## 2013-05-17 MED ORDER — SODIUM CHLORIDE 0.9 % IJ SOLN
3.0000 mL | Freq: Two times a day (BID) | INTRAMUSCULAR | Status: DC
  Administered 2013-05-17 – 2013-05-20 (×6): 3 mL via INTRAVENOUS

## 2013-05-17 MED ORDER — MENTHOL 3 MG MT LOZG
1.0000 | LOZENGE | OROMUCOSAL | Status: DC | PRN
  Filled 2013-05-17: qty 9

## 2013-05-17 MED ORDER — PHENYLEPHRINE HCL 10 MG/ML IJ SOLN
30.0000 ug/min | INTRAVENOUS | Status: DC
  Administered 2013-05-18: 35 ug/min via INTRAVENOUS
  Filled 2013-05-17: qty 1

## 2013-05-17 MED ORDER — SUCCINYLCHOLINE CHLORIDE 20 MG/ML IJ SOLN
INTRAMUSCULAR | Status: DC | PRN
  Administered 2013-05-17: 200 mg via INTRAVENOUS
  Administered 2013-05-17: 100 mg via INTRAVENOUS

## 2013-05-17 MED ORDER — METHOCARBAMOL 100 MG/ML IJ SOLN
500.0000 mg | Freq: Four times a day (QID) | INTRAVENOUS | Status: DC | PRN
  Administered 2013-05-19: 500 mg via INTRAVENOUS
  Filled 2013-05-17 (×2): qty 5

## 2013-05-17 MED ORDER — FUROSEMIDE 40 MG PO TABS
40.0000 mg | ORAL_TABLET | Freq: Every day | ORAL | Status: DC | PRN
  Filled 2013-05-17: qty 1

## 2013-05-17 MED ORDER — LORATADINE 10 MG PO TABS: 10.0000 mg | ORAL_TABLET | Freq: Every day | ORAL | Status: DC

## 2013-05-17 MED ORDER — PHENOL 1.4 % MT LIQD: 1.0000 | OROMUCOSAL | Status: DC | PRN

## 2013-05-17 MED ORDER — SODIUM CHLORIDE 0.9 % IV SOLN
250.0000 mL | INTRAVENOUS | Status: DC
  Administered 2013-05-17: 18:00:00 via INTRAVENOUS

## 2013-05-17 MED ORDER — MIDAZOLAM HCL 5 MG/5ML IJ SOLN
INTRAMUSCULAR | Status: DC | PRN
  Administered 2013-05-17: 2 mg via INTRAVENOUS

## 2013-05-17 MED ORDER — CEFAZOLIN SODIUM-DEXTROSE 2-3 GM-% IV SOLR
INTRAVENOUS | Status: AC
  Filled 2013-05-17: qty 50

## 2013-05-17 MED ORDER — HEMOSTATIC AGENTS (NO CHARGE) OPTIME
TOPICAL | Status: DC | PRN
  Administered 2013-05-17: 1 via TOPICAL

## 2013-05-17 MED ORDER — FENTANYL BOLUS VIA INFUSION
25.0000 ug | Freq: Four times a day (QID) | INTRAVENOUS | Status: DC | PRN
  Filled 2013-05-17: qty 100

## 2013-05-17 MED ORDER — LACTATED RINGERS IV SOLN
INTRAVENOUS | Status: DC
  Administered 2013-05-17: 85 mL/h via INTRAVENOUS

## 2013-05-17 MED ORDER — BUPIVACAINE-EPINEPHRINE PF 0.25-1:200000 % IJ SOLN
INTRAMUSCULAR | Status: AC
  Filled 2013-05-17: qty 60

## 2013-05-17 MED ORDER — SODIUM CHLORIDE 0.9 % IV SOLN
INTRAVENOUS | Status: DC | PRN
  Administered 2013-05-17: 15:00:00 via INTRAVENOUS

## 2013-05-17 MED ORDER — FENTANYL CITRATE 0.05 MG/ML IJ SOLN
INTRAMUSCULAR | Status: DC | PRN
  Administered 2013-05-17 (×3): 50 ug via INTRAVENOUS
  Administered 2013-05-17: 100 ug via INTRAVENOUS
  Administered 2013-05-17 (×2): 50 ug via INTRAVENOUS
  Administered 2013-05-17: 100 ug via INTRAVENOUS
  Administered 2013-05-17: 50 ug via INTRAVENOUS

## 2013-05-17 MED ORDER — THROMBIN 20000 UNITS EX SOLR
CUTANEOUS | Status: DC | PRN
  Administered 2013-05-17: 13:00:00 via TOPICAL

## 2013-05-17 MED ORDER — VANCOMYCIN HCL 500 MG IV SOLR
INTRAVENOUS | Status: DC | PRN
  Administered 2013-05-17: 500 mg

## 2013-05-17 MED ORDER — SODIUM CHLORIDE 0.9 % IJ SOLN: 3.0000 mL | INTRAMUSCULAR | Status: DC | PRN

## 2013-05-17 MED ORDER — DULOXETINE HCL 30 MG PO CPEP
30.0000 mg | ORAL_CAPSULE | Freq: Every day | ORAL | Status: DC
  Administered 2013-05-20: 30 mg via ORAL
  Filled 2013-05-17 (×4): qty 1

## 2013-05-17 MED ORDER — OXYCODONE HCL 5 MG PO TABS
10.0000 mg | ORAL_TABLET | ORAL | Status: DC | PRN
  Administered 2013-05-19 – 2013-05-20 (×2): 10 mg via ORAL
  Filled 2013-05-17 (×2): qty 2

## 2013-05-17 MED ORDER — PANTOPRAZOLE SODIUM 40 MG IV SOLR
40.0000 mg | INTRAVENOUS | Status: DC
  Administered 2013-05-17 – 2013-05-19 (×3): 40 mg via INTRAVENOUS
  Filled 2013-05-17 (×5): qty 40

## 2013-05-17 MED ORDER — VANCOMYCIN HCL 500 MG IV SOLR
INTRAVENOUS | Status: AC
  Filled 2013-05-17: qty 500

## 2013-05-17 MED ORDER — 0.9 % SODIUM CHLORIDE (POUR BTL) OPTIME
TOPICAL | Status: DC | PRN
  Administered 2013-05-17: 1000 mL

## 2013-05-17 MED ORDER — METHOCARBAMOL 500 MG PO TABS
500.0000 mg | ORAL_TABLET | Freq: Four times a day (QID) | ORAL | Status: DC | PRN
  Administered 2013-05-20: 500 mg via ORAL
  Filled 2013-05-17: qty 1

## 2013-05-17 MED ORDER — LEVOTHYROXINE SODIUM 100 MCG IV SOLR
75.0000 ug | Freq: Every day | INTRAVENOUS | Status: DC
  Administered 2013-05-17 – 2013-05-20 (×4): 75 ug via INTRAVENOUS
  Filled 2013-05-17 (×4): qty 5

## 2013-05-17 MED ORDER — PROPOFOL 10 MG/ML IV BOLUS
INTRAVENOUS | Status: DC | PRN
  Administered 2013-05-17: 40 mg via INTRAVENOUS
  Administered 2013-05-17: 160 mg via INTRAVENOUS

## 2013-05-17 MED ORDER — ONDANSETRON HCL 4 MG/2ML IJ SOLN: 4.0000 mg | Freq: Once | INTRAMUSCULAR | Status: AC | PRN

## 2013-05-17 MED ORDER — METHYLPREDNISOLONE SODIUM SUCC 125 MG IJ SOLR
INTRAMUSCULAR | Status: DC | PRN
  Administered 2013-05-17: 125 mg via INTRAVENOUS

## 2013-05-17 MED ORDER — LACTATED RINGERS IV SOLN
INTRAVENOUS | Status: DC | PRN
  Administered 2013-05-17 (×3): via INTRAVENOUS

## 2013-05-17 MED ORDER — HYDROMORPHONE HCL PF 1 MG/ML IJ SOLN
0.2500 mg | INTRAMUSCULAR | Status: AC | PRN
  Administered 2013-05-18 – 2013-05-19 (×8): 0.5 mg via INTRAVENOUS
  Filled 2013-05-17 (×6): qty 1

## 2013-05-17 MED ORDER — SODIUM CHLORIDE 0.9 % IV SOLN
25.0000 ug/h | INTRAVENOUS | Status: DC
  Administered 2013-05-17: 50 ug/h via INTRAVENOUS
  Administered 2013-05-18: 250 ug/h via INTRAVENOUS
  Filled 2013-05-17 (×2): qty 50

## 2013-05-17 MED ORDER — METHYLPREDNISOLONE SODIUM SUCC 125 MG IJ SOLR
60.0000 mg | Freq: Four times a day (QID) | INTRAMUSCULAR | Status: DC
  Filled 2013-05-17 (×3): qty 0.96

## 2013-05-17 MED ORDER — DEXAMETHASONE SODIUM PHOSPHATE 4 MG/ML IJ SOLN
4.0000 mg | Freq: Four times a day (QID) | INTRAMUSCULAR | Status: DC
  Administered 2013-05-17 – 2013-05-20 (×11): 4 mg via INTRAVENOUS
  Filled 2013-05-17 (×20): qty 1

## 2013-05-17 MED ORDER — BUPIVACAINE-EPINEPHRINE 0.25% -1:200000 IJ SOLN
INTRAMUSCULAR | Status: DC | PRN
  Administered 2013-05-17: 30 mL

## 2013-05-17 SURGICAL SUPPLY — 63 items
BLADE SURG ROTATE 9660 (MISCELLANEOUS) IMPLANT
BUR EGG ELITE 4.0 (BURR) ×2 IMPLANT
CANISTER SUCTION 2500CC (MISCELLANEOUS) ×2 IMPLANT
CAP SPINAL LOCKING TI (Cap) ×12 IMPLANT
CLOTH BEACON ORANGE TIMEOUT ST (SAFETY) ×2 IMPLANT
CLSR STERI-STRIP ANTIMIC 1/2X4 (GAUZE/BANDAGES/DRESSINGS) ×2 IMPLANT
CORDS BIPOLAR (ELECTRODE) ×2 IMPLANT
COVER MAYO STAND STRL (DRAPES) ×4 IMPLANT
COVER SURGICAL LIGHT HANDLE (MISCELLANEOUS) ×2 IMPLANT
DRAIN CHANNEL 15F RND FF W/TCR (WOUND CARE) ×2 IMPLANT
DRAPE C-ARM 42X72 X-RAY (DRAPES) ×2 IMPLANT
DRAPE ORTHO SPLIT 77X108 STRL (DRAPES) ×2
DRAPE POUCH INSTRU U-SHP 10X18 (DRAPES) ×2 IMPLANT
DRAPE SURG 17X23 STRL (DRAPES) ×2 IMPLANT
DRAPE SURG ORHT 6 SPLT 77X108 (DRAPES) ×2 IMPLANT
DRAPE U-SHAPE 47X51 STRL (DRAPES) ×2 IMPLANT
DRSG MEPILEX BORDER 4X8 (GAUZE/BANDAGES/DRESSINGS) ×2 IMPLANT
DURAPREP 26ML APPLICATOR (WOUND CARE) ×2 IMPLANT
ELECT BLADE 4.0 EZ CLEAN MEGAD (MISCELLANEOUS)
ELECT REM PT RETURN 9FT ADLT (ELECTROSURGICAL) ×2
ELECTRODE BLDE 4.0 EZ CLN MEGD (MISCELLANEOUS) IMPLANT
ELECTRODE REM PT RTRN 9FT ADLT (ELECTROSURGICAL) ×1 IMPLANT
EVACUATOR SILICONE 100CC (DRAIN) ×2 IMPLANT
GLOVE BIOGEL PI IND STRL 8.5 (GLOVE) ×1 IMPLANT
GLOVE BIOGEL PI INDICATOR 8.5 (GLOVE) ×1
GLOVE ECLIPSE 8.5 STRL (GLOVE) ×2 IMPLANT
GOWN PREVENTION PLUS XXLARGE (GOWN DISPOSABLE) ×4 IMPLANT
GOWN STRL REIN XL XLG (GOWN DISPOSABLE) ×4 IMPLANT
KIT BASIN OR (CUSTOM PROCEDURE TRAY) ×2 IMPLANT
KIT ORACLE NEUROMONITING (KITS) ×2 IMPLANT
KIT ROOM TURNOVER OR (KITS) ×2 IMPLANT
KIT STIMULAN RAPID CURE 5CC (Orthopedic Implant) ×2 IMPLANT
NEEDLE 22X1 1/2 (OR ONLY) (NEEDLE) ×2 IMPLANT
NEEDLE SPNL 18GX3.5 QUINCKE PK (NEEDLE) ×2 IMPLANT
NS IRRIG 1000ML POUR BTL (IV SOLUTION) ×4 IMPLANT
PACK LAMINECTOMY ORTHO (CUSTOM PROCEDURE TRAY) ×2 IMPLANT
PACK UNIVERSAL I (CUSTOM PROCEDURE TRAY) ×2 IMPLANT
PAD ARMBOARD 7.5X6 YLW CONV (MISCELLANEOUS) ×4 IMPLANT
PATTIES SURGICAL .5 X.5 (GAUZE/BANDAGES/DRESSINGS) IMPLANT
PATTIES SURGICAL .5 X1 (DISPOSABLE) ×4 IMPLANT
PUTTY BONE DBX 5CC MIX (Putty) ×2 IMPLANT
ROD 5.5X75MM (Rod) ×2 IMPLANT
ROD MATRIX 80MM (Rod) ×2 IMPLANT
SCREW MATRIX MIS 6.0X40MM (Screw) ×12 IMPLANT
SCREW MATRIX MIS 7.0X40MM (Screw) ×2 IMPLANT
SPONGE LAP 4X18 X RAY DECT (DISPOSABLE) ×12 IMPLANT
SPONGE SURGIFOAM ABS GEL 100 (HEMOSTASIS) ×2 IMPLANT
STRIP CLOSURE SKIN 1/2X4 (GAUZE/BANDAGES/DRESSINGS) ×2 IMPLANT
SURGIFLO TRUKIT (HEMOSTASIS) ×4 IMPLANT
SUT ETHILON 3 0 PS 1 (SUTURE) ×2 IMPLANT
SUT MNCRL AB 3-0 PS2 18 (SUTURE) ×2 IMPLANT
SUT VIC AB 1 CTX 36 (SUTURE) ×2
SUT VIC AB 1 CTX36XBRD ANBCTR (SUTURE) ×2 IMPLANT
SUT VIC AB 2-0 CT1 18 (SUTURE) ×2 IMPLANT
SYR BULB IRRIGATION 50ML (SYRINGE) ×2 IMPLANT
SYR CONTROL 10ML LL (SYRINGE) ×4 IMPLANT
TAP CANN 5MM (TAP) ×2 IMPLANT
TLIF LG 11MM (Orthopedic Implant) ×4 IMPLANT
TOWEL OR 17X24 6PK STRL BLUE (TOWEL DISPOSABLE) ×2 IMPLANT
TOWEL OR 17X26 10 PK STRL BLUE (TOWEL DISPOSABLE) ×2 IMPLANT
TRAY FOLEY CATH 16FRSI W/METER (SET/KITS/TRAYS/PACK) ×2 IMPLANT
WATER STERILE IRR 1000ML POUR (IV SOLUTION) IMPLANT
YANKAUER SUCT BULB TIP NO VENT (SUCTIONS) ×4 IMPLANT

## 2013-05-17 NOTE — Anesthesia Procedure Notes (Signed)
Procedure Name: Intubation Date/Time: 05/17/2013 10:37 AM Performed by: Margaree Mackintosh Pre-anesthesia Checklist: Patient identified, Timeout performed, Emergency Drugs available, Suction available and Patient being monitored Patient Re-evaluated:Patient Re-evaluated prior to inductionOxygen Delivery Method: Circle system utilized Preoxygenation: Pre-oxygenation with 100% oxygen Intubation Type: IV induction and Rapid sequence Ventilation: Oral airway inserted - appropriate to patient size and Mask ventilation without difficulty Laryngoscope Size: Miller and 2 (Glidescope) Grade View: Grade IV Tube type: Parker flex tip Tube size: 6.5 mm Number of attempts: 3 Airway Equipment and Method: Video-laryngoscopy (Cook catheter utilized) Placement Confirmation: ETT inserted through vocal cords under direct vision,  positive ETCO2 and breath sounds checked- equal and bilateral Secured at: 21 cm Tube secured with: Tape Dental Injury: Teeth and Oropharynx as per pre-operative assessment  Difficulty Due To: Difficulty was anticipated Future Recommendations: Recommend- induction with short-acting agent, and alternative techniques readily available Comments: Narrowed trachea and vocal cord paralysis known prior to induction, difficult airway cart and glidescope in room.  Intubation by [name], MD DLx1 with Hyacinth Meeker 2 blade no view, Glidescope utilized for second attempt parker tube placed no EtCO2, tube removed oral airway placed mask ventilation. VSS Third attempt with glidescope cook exchange catheter utilized to pass through glottic opening, Parker 6.5 ETT placed over cook catheter, +EtCO2 bilateral breath sounds present and equal.

## 2013-05-17 NOTE — Anesthesia Postprocedure Evaluation (Signed)
  Anesthesia Post-op Note  Patient: Stephanie Elliott  Procedure(s) Performed: Procedure(s): L3-5 DECOMPRESSION TLIF L4-L5 POSTERIOR SPINAL FUSION INTERBODY L4-S1  (N/A)  Patient Location: SICU  Anesthesia Type:General  Level of Consciousness: sedated and Patient remains intubated per anesthesia plan  Airway and Oxygen Therapy: Patient remains intubated per anesthesia plan and Patient placed on Ventilator (see vital sign flow sheet for setting)  Post-op Pain: none  Post-op Assessment: Post-op Vital signs reviewed and Patient's Cardiovascular Status Stable  Post-op Vital Signs: stable  Complications: No apparent anesthesia complications

## 2013-05-17 NOTE — Preoperative (Signed)
Beta Blockers   Reason not to administer Beta Blockers:Not Applicable 

## 2013-05-17 NOTE — Progress Notes (Signed)
eLink Physician-Brief Progress Note Patient Name: Stephanie Elliott DOB: [birth year] MRN: [id number]  Date of Service  05/17/2013   HPI/Events of Note  Post op out on neo  eICU Interventions  Neo ordered, dc precedex which may contribute to hypotension. Trop x 1, ecg no st changes, abg to ensure adequate MV and not acidotic Cbc no major drop May bolus    Intervention Category Major Interventions: Hypotension - evaluation and management  Samba Cumba J. 05/17/2013, 9:54 PM
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
## 2013-05-17 NOTE — Transfer of Care (Signed)
Immediate Anesthesia Transfer of Care Note  Patient: Stephanie Elliott  Procedure(s) Performed: Procedure(s): L3-5 DECOMPRESSION TLIF L4-L5 POSTERIOR SPINAL FUSION INTERBODY L4-S1  (N/A)  Patient Location: ICU 2312  Anesthesia Type:General  Level of Consciousness: sedated  Airway & Oxygen Therapy: Patient remains intubated per anesthesia plan and Patient placed on Ventilator (see vital sign flow sheet for setting)  Post-op Assessment: Report to ICU RN 2312 jennifer, RN  Post vital signs: Reviewed and stable  Complications: No apparent anesthesia complications

## 2013-05-17 NOTE — Progress Notes (Signed)
Dr Delton Coombes to bedside. Updated on ABG results. Updated FiO2 decreased to 40%. Pt intermittently wakes up to main, not currently following commands. MD aware plan per anesthesia/surgeon to leave intubated overnight. Currently on precedex gtt @1mcg  from OR. MD ok to continue. Pt on neosynephrine gtt during surgery, off now. CBC/BMP pending. MD assessed pt at the bedside. Will continue to monitor. Koren Bound

## 2013-05-17 NOTE — H&P (Signed)
Notes reviewed No change in clinical exam If unable to intubate with adequate size tube will cancel case. Patient aware
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
## 2013-05-17 NOTE — Progress Notes (Signed)
CCM Tyson Alias) MD updated on pt status. Updated sbp trending down. On arrival to unit post op, sbp >100 via aline. sbp now 70-80s. MD updated pt on neosynephrine gtt during surgery, off on arrival to unit. Updated post op H/H. Updated pt on precedex gtt post op, order to change to fentanyl gtt. Updated JP drain x1 to back near incision with bloody drainage. Orders received, will continue to monitor. Koren Bound

## 2013-05-17 NOTE — Brief Op Note (Signed)
05/17/2013  4:34 PM  PATIENT:  Stephanie Elliott  52 y.o. female  PRE-OPERATIVE DIAGNOSIS:  DDD L5-S1 SPINAL STENOSIS L3-L5 SLIP L4-L5   POST-OPERATIVE DIAGNOSIS:  DDD L5-S1 SPINAL STENOSIS L3-L5 SLIP L4-L5   PROCEDURE:  Procedure(s): L3-5 DECOMPRESSION TLIF L4-L5 POSTERIOR SPINAL FUSION INTERBODY L4-S1  (N/A)  SURGEON:  Surgeon(s) and Role:    * Venita Lick, MD - Primary  PHYSICIAN ASSISTANT:   ASSISTANTS: none   ANESTHESIA:   general  EBL:  Total I/O In: 2810 [I.V.:2250; Blood:310; IV Piggyback:250] Out: 1410 [Urine:310; Blood:1100]  BLOOD ADMINISTERED:310 CC CELLSAVER  DRAINS: (1) Jackson-Pratt drain(s) with closed bulb suction in the back   LOCAL MEDICATIONS USED:  NONE  SPECIMEN:  No Specimen  DISPOSITION OF SPECIMEN:  N/A  COUNTS:  YES  TOURNIQUET:  * No tourniquets in log *  DICTATION: .Other Dictation: Dictation Number [id number]  PLAN OF CARE: Admit to inpatient   PATIENT DISPOSITION:  ICU - intubated and hemodynamically stable.

## 2013-05-18 ENCOUNTER — Encounter (HOSPITAL_COMMUNITY): Payer: Self-pay | Admitting: Orthopedic Surgery

## 2013-05-18 ENCOUNTER — Inpatient Hospital Stay (HOSPITAL_COMMUNITY): Payer: Medicare Other

## 2013-05-18 LAB — CBC
HCT: 30.5 % — ABNORMAL LOW (ref 36.0–46.0)
MCV: 82.2 fL (ref 78.0–100.0)
RBC: 3.71 MIL/uL — ABNORMAL LOW (ref 3.87–5.11)
RDW: 12.4 % (ref 11.5–15.5)
WBC: 10.1 10*3/uL (ref 4.0–10.5)

## 2013-05-18 LAB — POCT I-STAT 3, ART BLOOD GAS (G3+)
Bicarbonate: 25.9 mEq/L — ABNORMAL HIGH (ref 20.0–24.0)
Bicarbonate: 24.3 mEq/L — ABNORMAL HIGH (ref 20.0–24.0)
Patient temperature: 98.6
Patient temperature: 97.7
TCO2: 26 mmol/L (ref 0–100)
pCO2 arterial: 43.1 mmHg (ref 35.0–45.0)
pH, Arterial: 7.356 (ref 7.350–7.450)
pH, Arterial: 7.388 (ref 7.350–7.450)
pO2, Arterial: 166 mmHg — ABNORMAL HIGH (ref 80.0–100.0)

## 2013-05-18 LAB — GLUCOSE, CAPILLARY
Glucose-Capillary: 118 mg/dL — ABNORMAL HIGH (ref 70–99)
Glucose-Capillary: 98 mg/dL (ref 70–99)
Glucose-Capillary: 97 mg/dL (ref 70–99)
Glucose-Capillary: 106 mg/dL — ABNORMAL HIGH (ref 70–99)
Glucose-Capillary: 94 mg/dL (ref 70–99)

## 2013-05-18 LAB — BASIC METABOLIC PANEL
BUN: 11 mg/dL (ref 6–23)
GFR calc non Af Amer: >90 mL/min (ref >90)
Glucose, Bld: 135 mg/dL — ABNORMAL HIGH (ref 70–99)
Potassium: 3.9 mEq/L (ref 3.5–5.1)

## 2013-05-18 MED ORDER — BIOTENE DRY MOUTH MT LIQD
15.0000 mL | Freq: Four times a day (QID) | OROMUCOSAL | Status: DC
  Administered 2013-05-18 – 2013-05-19 (×6): 15 mL via OROMUCOSAL

## 2013-05-18 MED ORDER — SODIUM CHLORIDE 0.9 % IV BOLUS (SEPSIS): 250.0000 mL | Freq: Once | INTRAVENOUS | Status: AC

## 2013-05-18 MED ORDER — CHLORHEXIDINE GLUCONATE 0.12 % MT SOLN
15.0000 mL | Freq: Two times a day (BID) | OROMUCOSAL | Status: DC
  Administered 2013-05-18 – 2013-05-19 (×4): 15 mL via OROMUCOSAL
  Filled 2013-05-18 (×3): qty 15

## 2013-05-18 MED ORDER — BISACODYL 5 MG PO TBEC: 5.0000 mg | DELAYED_RELEASE_TABLET | Freq: Every day | ORAL | Status: DC | PRN

## 2013-05-18 MED FILL — Sodium Chloride IV Soln 0.9%: INTRAVENOUS | Qty: 1000 | Status: AC

## 2013-05-18 MED FILL — Sodium Chloride Irrigation Soln 0.9%: Qty: 3000 | Status: AC

## 2013-05-18 MED FILL — Heparin Sodium (Porcine) Inj 1000 Unit/ML: INTRAMUSCULAR | Qty: 30 | Status: AC

## 2013-05-18 NOTE — Progress Notes (Signed)
Utilization review completed.  

## 2013-05-18 NOTE — Op Note (Signed)
NAME:  Stephanie Elliott NO.:  [id number]  MEDICAL RECORD NO.:  [id number]  LOCATION:  2S12C                        FACILITY:  [facility]  PHYSICIAN:  Alvy Beal, MD    DATE OF BIRTH:  1961-02-06  DATE OF PROCEDURE:  05/17/2013 DATE OF DISCHARGE:                              OPERATIVE REPORT   PREOPERATIVE DIAGNOSIS:  Lumbar spinal stenosis with spondylolisthesis and scoliosis.  POSTOPERATIVE DIAGNOSIS:  Lumbar spinal stenosis with spondylolisthesis and scoliosis.  OPERATIVE PROCEDURES: 1. L3-4, L4-5 decompression. 2. Gill decompression, L4-5 right side. 3. Complete diskectomy with implantation of intervertebral     biomechanical device (Titan, size 11 x 231 TLIF cage packed with     local bone and DBX mix). 4. Posterior arthrodesis L4-5, L5-S1 with local bone graft. 5. Segmental instrumentation L4-5, L5-S1 with Synthes MIS pedicle     screw fixation.  COMPLICATIONS:  None.  CONDITION:  Stable.  Neuro monitoring performed throughout the case.  No adverse events noted.  HISTORY:  Stephanie Elliott is a very pleasant woman who has been under my care for sometime.  She had a prolonged significant back, buttock, and bilateral leg pain left worse than the right.  Attempts at conservative management had failed to alleviate her symptoms.  As a result, we elected to proceed with surgery.  The patient had spinal stenosis at 3-4 and so required a decompression as well as spondylolisthesis with stenosis and slight scoliosis at L4-5.  This also required decompression but instrumentation because of the underlying instability.  Because of the degenerative disease at the level below and the previous surgery the level below, I elected to extend the fusion down to S1 for better control of the scoliosis.  All risks, benefits, and alternatives were discussed with the patient preoperatively and consent was obtained.  OPERATIVE NOTE:  The patient was brought to the operating room,  placed supine on the operating room table.  After successful induction of general anesthesia and endotracheal intubation, the patient was turned prone onto the spine frame.  All bony prominences were well padded.  The back was prepped and draped in a standard fashion.  Time-out was done confirming patient, procedure, and all other pertinent important data.  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  The anesthesiologist only had a 6 tube in.  Because of this, we elected to start the case to keep her intubated and extubated in a more controlled fashion in the morning with the assistance of anesthesia.  This will be under the guidance of the anesthesiologist.  Once a time-out was done, I made a midline incision starting at the superior aspect of the L3 spinous process and proceeding down to the S1 spinous process.  Sharp dissection was carried out down to the deep fascia.  Deep fascia was stripped using a Cobb elevator and electrocautery to expose the L3 and the L4 spinous processes bilaterally.  I then exposed the S1 spinous process, and L5-S1 facet complex.  I then dissected through the scar to expose the L4-5 facet complex.  At this point, I had excellent exposure bilaterally.  I then confirmed the L4-5 level.  I then dissected over the lateral aspect of the L3-4 facet complex keeping this in and not violating the facet complex.  I then exposed the L4 transverse process.  I then removed the facet capsule at L4-5 and L5-S1 and exposed a portion of the sacral ala and the L5 transverse process, this was done bilaterally.  I then used fluoroscopy and direct visualization.  At L4, I placed my awl on the junction of the transverse process and the facet.  I popped through the cortex and advanced the pedicle probe through the pedicle and into the vertebral body.  I repeated this at L5 and at S1  and on the contralateral side.  Once I had all 6 probes in place, I then sequentially removed each probe, palpated the hole with ball-tip Feeler, tapped, repalpated the hole with ball-tip Feeler, and then placed the appropriate size screw.  This was on the right side 6.0 diameter 40 mm length screws at L4, L5, and S1.  I then stemmed all 3 screws on the right side, and all stemmed appropriately.  There was no evidence of breach of the pedicle.  I then went to the left-hand side, and then again using the same technique removed the probes, palpated the hole, then placed the screws and then tested them all.  At this point, with the 4-5 and S1 pedicle screws in place, I then proceeded with my decompression.  The spinous process of L4 was removed as was the majority of that of L3.  I then performed a laminotomy using a 2 and 3 mm Kerrison rongeurs at L3.  I then removed the ligamentum flavum and performed a central decompression at the L3-4 level.  I then went into the lateral gutter, and removed the buckled thickened ligamentum flavum. There was significant stenosis at this level.  I went down so I was able to palpate the L3 pedicle.  At this point, once I could palpate with my [facility] elevator the L3 pedicle, I knew I was cranial enough with my decompression.  I then proceeded inferiorly down the left lateral gutter removing thickened ligamentum flavum and osteophytes until I could palpate the L4 pedicle.  I then went and started my L4-5 decompression. Using the same technique, I began my laminotomy with a 2 and 3 mm Kerrison rongeur.  I then identified the thecal sac, protected it with a Neuro patty, and then continued superiorly.  I then completed the L4 laminectomy and I had an excellent decompression from L3 down to L5.  I then went back into the left lateral gutter and completed my decompression removing thickened ligamentum flavum and osteophyte.  I could now palpate the superior  medial and inferior aspect of the L4 pedicle out the L4 foramen.  I could now palpate the L5 pedicle out the L5 foramen.  All 3 nerves were satisfactory decompressed in the foramen. With the decompression and foraminotomies completed, I then went to the contralateral side.  I decompressed the L3-4 lateral recess in the same fashion on the right side.  At L4-5, I removed the entire with an osteotome, I removed the inferior L4 facet in its entirety, and started treating my Gill decompression.  I identified the L4 pedicle and identified the L4 nerve root and then resected the pars and overlying thickened ligamentum flavum and facet capsule that was causing nerve compression.  Once I had this completely decompressed, I went inferiorly, identified the L5 nerve root  and performed a foraminotomy. I could now visualize the L5 and the L4 pedicle.  I could now also visualize the posterolateral aspect of the disk.  At this point with the decompression complete, I then proceeded with the TLIF.  I incised the disk and then using a combination of pituitary rongeurs, curettes, and Kerrison rongeurs, I removed all of the disk material.  I packed bone graft.  I then scraped the endplates so I had bleeding subchondral bone.  I then packed bone along the anterior annulus and then measured sequentially with trials.  I then used a 11 x 31 cage packed with local bone.  The cage had excellent purchase.  Once this was done, I then malleted into the appropriate horizontal position.  I then irrigated the wound copiously with normal saline.  I then contoured the rods and secured them into the pedicle screws and torqued them down appropriately.  At this point, AP x-ray demonstrated that the scoliosis was improved, and so I elected not to involve the L3 level with my instrumentation.  At this point, I then irrigated copiously with normal saline, made sure I had hemostasis and then I placed a deep drain.  I also  placed vancomycin impregnated beads into the wound.  I then closed over the drain with #1 Vicryl sutures, then 2-0 interrupted Vicryl sutures and a 3-0 Monocryl.  Steri-Strips and dry dressing were applied.  Because of the tracheal stenosis, we elected to keep the patient intubated.  She was brought to the SICU intubated.     Alvy Beal, MD     DDB/MEDQ  D:  05/17/2013  T:  05/18/2013  Job:  [id number]

## 2013-05-18 NOTE — Procedures (Signed)
Extubation Procedure Note  Patient Details:   Name: Stephanie Elliott DOB: 1961/01/03 MRN: [id number]   Airway Documentation:     Evaluation  O2 sats: stable throughout Complications: No apparent complications Patient did tolerate procedure well. Bilateral Breath Sounds: Clear Suctioning: Airway;Oral Yes Cuff leak positive Anesthesia at bedside for extubation  Newt Lukes 05/18/2013, 10:21 AM
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
## 2013-05-18 NOTE — Progress Notes (Signed)
PT Cancellation Note  Patient Details Name: Stephanie Elliott MRN: [id number] DOB: Apr 13, 1961   Cancelled Treatment:    Reason Eval/Treat Not Completed: Medical issues which prohibited therapy (pt remains on vent at this time). Will attempt next date.   Toney Sang Beth 05/18/2013, 7:09 AM Delaney Meigs, PT [phone]

## 2013-05-18 NOTE — Progress Notes (Signed)
Pt. Extubated to [facility] by Anesthesiologist at bedside. Pt. Tolerated well. No acute distress. No complaints at present. Lungs clear to auscultation, no stridor or SOB noted. Pt. Voice clear and she states no pain at this time. Father at bedside. Updated on pt. Status and POC. Agreeable and questions answered. Will continue to monitor, educate, assess closely. Gildardo Pounds

## 2013-05-18 NOTE — Progress Notes (Signed)
Respiratory therapy note- Patient does have positive cuff leak. Continue to monitor.

## 2013-05-18 NOTE — Progress Notes (Signed)
    Subjective: Procedure(s) (LRB): L3-5 DECOMPRESSION TLIF L4-L5 POSTERIOR SPINAL FUSION INTERBODY L4-S1  (N/A) 1 Day Post-Op  Patient reports pain as 3 on 0-10 scale.  Reports decreased leg pain reports incisional back pain   N/A void - foley Negative bowel movement Positive flatus Negative chest pain or shortness of breath  Objective: Vital signs in last 24 hours: Temp:  [97.5 F (36.4 C)-98.6 F (37 C)] 97.5 F (36.4 C) (08/15 0741) Pulse Rate:  [55-92] 68 (08/15 0700) Resp:  [11-16] 12 (08/15 0700) BP: (63-134)/(38-86) 98/61 mmHg (08/15 0700) SpO2:  [96 %-100 %] 100 % (08/15 0700) FiO2 (%):  [40 %-50 %] 40 % (08/15 0700) Weight:  [73.029 kg (161 lb)] 73.029 kg (161 lb) (08/14 0901)  Intake/Output from previous day: 08/14 0701 - 08/15 0700 In: 5691.1 [I.V.:4611.1; Blood:310; NG/GT:120; IV Piggyback:650] Out: 2580 [Urine:1015; Emesis/NG output:150; Drains:215; Blood:1200]  Labs:  Recent Labs  05/17/13 1815 05/18/13 0415  WBC 8.1 10.1  RBC 4.06 3.71*  HCT 33.3* 30.5*  PLT 130* 170    Recent Labs  05/17/13 1815 05/18/13 0415  NA 137 136  K 3.8 3.9  CL 105 103  CO2 26 25  BUN 8 11  CREATININE 0.43* 0.41*  GLUCOSE 175* 135*  CALCIUM 8.3* 8.7   No results found for this basename: LABPT, INR,  in the last 72 hours  Physical Exam: Neurologically intact ABD soft Intact pulses distally Incision: dressing C/D/I and no drainage Compartment soft  Assessment/Plan: Patient stable  xrays N/A Continue mobilization with physical therapy Continue care  Extubation per anesthesia/ENT May transfer to floor after extubation if remain stable. Doing well from spine standpoint - continue drain.    Venita Lick, MD [facility] [phone]

## 2013-05-18 NOTE — Progress Notes (Signed)
LR Arterial Line removed per MD order.  Pressure held x 10 minutes.  Area cleaned after removal of line.  No drainage noted.

## 2013-05-18 NOTE — Progress Notes (Signed)
Stephanie Elliott is awake and alert, stable overnight. Good cuff leak as noted around 6.5 mm ETT.   Pt extubated without difficult, good air movement post-extubation without stridor.   VS: T-36.4 RR 16 BP 127/79 HR 101 O2 Sat 98% on 3L nasal cannula Heart RRR Lungs- clear with good air movement bilaterally  Stable post extubation, no evidence of airway obstruction. Case discussed with Dr. Lazarus Salines no need for ENT evaluation at present.  [name]

## 2013-05-19 LAB — CBC
Hemoglobin: 9.3 g/dL — ABNORMAL LOW (ref 12.0–15.0)
MCH: 29.7 pg (ref 26.0–34.0)
MCHC: 35.8 g/dL (ref 30.0–36.0)
MCV: 83.1 fL (ref 78.0–100.0)
Platelets: 141 10*3/uL — ABNORMAL LOW (ref 150–400)

## 2013-05-19 LAB — BASIC METABOLIC PANEL
BUN: 12 mg/dL (ref 6–23)
Calcium: 8.4 mg/dL (ref 8.4–10.5)
GFR calc non Af Amer: >90 mL/min (ref >90)
Glucose, Bld: 119 mg/dL — ABNORMAL HIGH (ref 70–99)

## 2013-05-19 MED ORDER — HYDROMORPHONE HCL PF 1 MG/ML IJ SOLN
0.2500 mg | INTRAMUSCULAR | Status: DC | PRN
  Administered 2013-05-19 (×4): 0.5 mg via INTRAVENOUS
  Filled 2013-05-19 (×2): qty 1

## 2013-05-19 MED ORDER — LACTATED RINGERS IV SOLN
INTRAVENOUS | Status: DC
  Administered 2013-05-20: 20 mL/h via INTRAVENOUS

## 2013-05-19 NOTE — Progress Notes (Signed)
PT Cancellation Note  Patient Details Name: Stephanie Elliott MRN: [id number] DOB: [birth year]   Cancelled Treatment:    Reason Eval/Treat Not Completed: Other (comment) (pt reports MD ordered brace preop and that she needs it for mobility. Dad to bring brace shortly and will attempt later.)   Delorse Lek 05/19/2013, 8:25 AM Delaney Meigs, PT [phone]
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
## 2013-05-19 NOTE — Progress Notes (Signed)
Pt has done very well post extubation and has no ongoing pulmonary or CCM issues. Agree with transfer to med-surg floor. PCCM will sign off. Please call if we can be of further assistance  Billy Fischer, MD ; [facility] [phone].  After 5:30 PM or weekends, call [phone]

## 2013-05-19 NOTE — Progress Notes (Signed)
Foley removed per MD order. Balloon deflated. Catheter removed intact. Pt advised about possible dysuria and states understanding. Will continue to monitor.

## 2013-05-19 NOTE — Progress Notes (Signed)
Patient ID: Stephanie Elliott, female   DOB: 1961-09-30, 52 y.o.   MRN: [id number] Subjective: 2 Days Post-Op Procedure(s) (LRB): L3-5 DECOMPRESSION TLIF L4-L5 POSTERIOR SPINAL FUSION INTERBODY L4-S1  (N/A)    Patient reports pain as mild.  Improved from pre-operative pain Ready to get out of bed and start moving around  Objective:   VITALS:   Filed Vitals:   05/19/13 0830  BP: 111/71  Pulse: 88  Temp:   Resp: 14    Incision: dressing C/D/I Drain removed today - suture clipped prior, gauze applied to allow for some expectant ooze   LABS  Recent Labs  05/17/13 1815 05/18/13 0415 05/19/13 0353  HGB 11.7* 10.7* 9.3*  HCT 33.3* 30.5* 26.0*  WBC 8.1 10.1 8.8  PLT 130* 170 141*     Recent Labs  05/17/13 1815 05/18/13 0415 05/19/13 0353  NA 137 136 139  K 3.8 3.9 3.7  BUN 8 11 12   CREATININE 0.43* 0.41* 0.43*  GLUCOSE 175* 135* 119*    No results found for this basename: LABPT, INR,  in the last 72 hours   Assessment/Plan: 2 Days Post-Op Procedure(s) (LRB): L3-5 DECOMPRESSION TLIF L4-L5 POSTERIOR SPINAL FUSION INTERBODY L4-S1  (N/A)   Advance diet Up with therapy OOB with therapy  Ok to transfer out of unit if cleared from CCM standpoint
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
## 2013-05-19 NOTE — Progress Notes (Signed)
Wasted fent. From bag, flushed down sink, and witnessed by second RN, A. Leonette Monarch

## 2013-05-20 LAB — POCT I-STAT 7, (LYTES, BLD GAS, ICA,H+H)
Calcium, Ion: 1.1 mmol/L — ABNORMAL LOW (ref 1.12–1.23)
O2 Saturation: 100 %
Patient temperature: 36.7
TCO2: 25 mmol/L (ref 0–100)
pCO2 arterial: 40.1 mmHg (ref 35.0–45.0)
pO2, Arterial: 466 mmHg — ABNORMAL HIGH (ref 80.0–100.0)

## 2013-05-20 MED ORDER — SODIUM CHLORIDE 0.9 % IJ SOLN: 10.0000 mL | INTRAMUSCULAR | Status: DC | PRN

## 2013-05-20 MED ORDER — SODIUM CHLORIDE 0.9 % IJ SOLN: 10.0000 mL | Freq: Two times a day (BID) | INTRAMUSCULAR | Status: DC

## 2013-05-20 MED ORDER — LEVOTHYROXINE SODIUM 150 MCG PO TABS
150.0000 ug | ORAL_TABLET | Freq: Every day | ORAL | Status: DC
  Administered 2013-05-21: 150 ug via ORAL
  Filled 2013-05-20 (×2): qty 1

## 2013-05-20 MED ORDER — PANTOPRAZOLE SODIUM 40 MG PO TBEC
40.0000 mg | DELAYED_RELEASE_TABLET | Freq: Every day | ORAL | Status: DC
  Administered 2013-05-20: 40 mg via ORAL
  Filled 2013-05-20: qty 1

## 2013-05-20 NOTE — Progress Notes (Signed)
Physical Therapy Treatment Patient Details Name: ETHER GOEBEL MRN: [id number] DOB: 1961-08-07 Today's Date: 05/20/2013 Time: 4098-1191 PT Time Calculation (min): 17 min  PT Assessment / Plan / Recommendation  History of Present Illness L3-5 DECOMPRESSION TLIF L4-L5 POSTERIOR SPINAL FUSION INTERBODY L4-S1  (N/A   PT Comments   Pt progressing well. Anticipate pt to be safe for d/c home once medically stable.  Follow Up Recommendations  No PT follow up     Does the patient have the potential to tolerate intense rehabilitation     Barriers to Discharge        Equipment Recommendations  None recommended by PT    Recommendations for Other Services    Frequency Min 5X/week   Progress towards PT Goals    Plan Current plan remains appropriate    Precautions / Restrictions Precautions Precautions: Back Precaution Booklet Issued: Yes (comment) Precaution Comments: pt able to recall 3/3 precautions however v/c's to adhere to them Required Braces or Orthoses: Spinal Brace Spinal Brace: Lumbar corset;Applied in sitting position Restrictions Weight Bearing Restrictions: No   Pertinent Vitals/Pain 4/10 bilat LE pain, pt report mild soreness at surgical incision    Mobility  Bed Mobility Bed Mobility: Not assessed Details for Bed Mobility Assistance: pt in chair on arrival  Transfers Transfers: Sit to Stand;Stand to Sit Sit to Stand: 5: Supervision;From chair/3-in-1 Stand to Sit: 5: Supervision;To chair/3-in-1 Details for Transfer Assistance: v/c's to limit increased trunk flexion Ambulation/Gait Ambulation/Gait Assistance: 4: Min guard Ambulation Distance (Feet): 300 Feet Assistive device: None Ambulation/Gait Assistance Details: no episodes of LOB, v/c's to decrease pace Gait Pattern: Within Functional Limits Stairs: No    Exercises     PT Diagnosis:    PT Problem List:   PT Treatment Interventions:     PT Goals (current goals can now be found in the care plan  section)    Visit Information  Last PT Received On: 05/20/13 Assistance Needed: +1 History of Present Illness: L3-5 DECOMPRESSION TLIF L4-L5 POSTERIOR SPINAL FUSION INTERBODY L4-S1  (N/A    Subjective Data  Subjective: Pt received sitting up in recliner   Cognition  Cognition Arousal/Alertness: Awake/alert Behavior During Therapy: WFL for tasks assessed/performed Overall Cognitive Status: Within Functional Limits for tasks assessed    Balance     End of Session PT - End of Session Equipment Utilized During Treatment: Back brace Activity Tolerance: Patient tolerated treatment well Patient left: in chair;with call bell/phone within reach;with nursing/sitter in room Nurse Communication: Mobility status   GP     Marcene Brawn 05/20/2013, 1:25 PM Lewis Shock, PT, DPT Pager #: [phone] Office #: [phone]

## 2013-05-20 NOTE — Progress Notes (Signed)
Pt transferred to 5N20. Pt stable at time of transport. Report was called to receiving RN. All pts belongings were transported with her to the new room

## 2013-05-20 NOTE — Progress Notes (Signed)
Speech Language Pathology Dysphagia Treatment Patient Details Name: Stephanie Elliott MRN: [id number] DOB: 09/21/1961 Today's Date: 05/20/2013 Time: 4098-1191 SLP Time Calculation (min): 15 min  Assessment / Plan / Recommendation Clinical Impression  F/u for diet tolerance of dysphagia 1 and thin liquids and possible advancement from initial BSE.  Observed directly with dysphagia 3, puree, and regular solids.  Presents well at bedside with no outward s/s of aspiration noted.   Review of vital signs indicates no changes in respiratory status or temp.  Nursing reports clear lung sounds.  Recommend to upgrade to regular consistency but continue full supervision inititally to ensure safety.  ST to continue in acute care for diet tolerance.  No outward s/s of aspiration noted but patient at risk for silent aspiration secondary to PMH.     Diet Recommendation  Initiate / Change Diet: Regular;Thin liquid    SLP Plan Continue with current plan of care      Swallowing Goals  SLP Swallowing Goals Swallow Study Goal #2 - Progress: Progressing toward goal  General Temperature Spikes Noted: No Respiratory Status: Room air Behavior/Cognition: Alert;Cooperative;Pleasant mood Oral Cavity - Dentition: Adequate natural dentition Patient Positioning: Upright in chair  Oral Cavity - Oral Hygiene Does patient have any of the following "at risk" factors?: Other - dysphagia Patient is HIGH RISK - Oral Care Protocol followed (see row info): Yes Patient is AT RISK - Oral Care Protocol followed (see row info): Yes   Dysphagia Treatment Treatment focused on: Skilled observation of diet tolerance;Upgraded PO texture trials;Patient/family/caregiver education;Facilitation of pharyngeal phase;Utilization of compensatory strategies Treatment Methods/Modalities: Skilled observation;Differential diagnosis Patient observed directly with PO's: Yes Type of PO's observed: Regular;Dysphagia 3 (soft);Dysphagia 1  (puree) Feeding: Able to feed self Liquids provided via: Cup Pharyngeal Phase Signs & Symptoms: Delayed throat clear   GO    [name] MS, CCC-SLP [phone] [facility] 05/20/2013, 5:33 PM

## 2013-05-20 NOTE — Progress Notes (Signed)
Stephanie Elliott  MRN: [id number] DOB/Age: 01-14-1961 52 y.o. Physician: Lynnea Maizes, M.D. 3 Days Post-Op Procedure(s) (LRB): L3-5 DECOMPRESSION TLIF L4-L5 POSTERIOR SPINAL FUSION INTERBODY L4-S1  (N/A)  Subjective: Resting comfortably, no c/o Vital Signs Temp:  [97.3 F (36.3 C)-98.3 F (36.8 C)] 97.3 F (36.3 C) (08/17 0808) Pulse Rate:  [65-121] 65 (08/17 0800) Resp:  [12-21] 15 (08/17 0800) BP: (78-137)/(41-99) 112/67 mmHg (08/17 0800) SpO2:  [95 %-100 %] 99 % (08/17 0800)  Lab Results  Recent Labs  05/18/13 0415 05/19/13 0353  WBC 10.1 8.8  HGB 10.7* 9.3*  HCT 30.5* 26.0*  PLT 170 141*   BMET  Recent Labs  05/18/13 0415 05/19/13 0353  NA 136 139  K 3.9 3.7  CL 103 105  CO2 25 28  GLUCOSE 135* 119*  BUN 11 12  CREATININE 0.41* 0.43*  CALCIUM 8.7 8.4   INR  Date Value Range Status  09/04/2009 1.04  0.00 - 1.49 Final     Exam  Moves legs well, no SOB, ambulating around unit without difficulty  Plan Transfer to ortho floor, mobilize  Stephanie Elliott M 05/20/2013, 10:37 AM

## 2013-05-21 MED ORDER — POLYETHYLENE GLYCOL 3350 17 GM/SCOOP PO POWD: 17.0000 g | Freq: Every day | ORAL | Status: DC

## 2013-05-21 MED ORDER — ONDANSETRON HCL 4 MG PO TABS: 4.0000 mg | ORAL_TABLET | Freq: Three times a day (TID) | ORAL | Status: DC | PRN

## 2013-05-21 NOTE — Progress Notes (Signed)
05/21/13 Spoke with patient about HHC. She selected [facility] from [facility]agencies list. Contacted Marie at [facility] and set up HHPT. No equipment needs identified. Lives with husband. Jacquelynn Cree RN, BSN, CCM

## 2013-05-21 NOTE — Progress Notes (Signed)
Physical Therapy Treatment Patient Details Name: Stephanie Elliott MRN: [id number] DOB: 04-17-1961 Today's Date: 05/21/2013 Time: 2841-3244 PT Time Calculation (min): 13 min  PT Assessment / Plan / Recommendation  History of Present Illness L3-5 DECOMPRESSION TLIF L4-L5 POSTERIOR SPINAL FUSION INTERBODY L4-S1  (N/A   PT Comments   Pt has met all acute PT goals for mobility at this time. Pt encouraged to have undershirt on prior to donning brace for proper wear. Pt verbalized understanding. Will benefit from HHPT to ensure safe transition home.   Follow Up Recommendations  Home health PT;Supervision - Intermittent     Does the patient have the potential to tolerate intense rehabilitation     Barriers to Discharge        Equipment Recommendations  None recommended by PT    Recommendations for Other Services    Frequency Min 5X/week   Progress towards PT Goals Progress towards PT goals: Goals met and updated - see care plan  Plan Discharge plan needs to be updated    Precautions / Restrictions Precautions Precautions: Back Precaution Comments: pt able to recall 3/3 back precautions Required Braces or Orthoses: Spinal Brace Spinal Brace: Lumbar corset;Applied in sitting position Restrictions Weight Bearing Restrictions: No   Pertinent Vitals/Pain 2/10     Mobility  Bed Mobility Bed Mobility: Sit to Sidelying Left;Rolling Right Rolling Right: 5: Supervision Sit to Sidelying Left: 5: Supervision;HOB flat;With rail Details for Bed Mobility Assistance: supervision for cues for log rolling technique; no physical (A) needed; pt relied on handrail; plans to sleep in recliner and have HHPT practice bed mobility with her Transfers Transfers: Stand to Sit Stand to Sit: 6: Modified independent (Device/Increase time);To bed Details for Transfer Assistance: pt demo good technique; no physical (A) needed Ambulation/Gait Ambulation/Gait Assistance: 5: Supervision Ambulation Distance  (Feet): 300 Feet Assistive device: None Ambulation/Gait Assistance Details: no LOB noted; pt amb without AD and demo good safety awareness Gait Pattern: Within Functional Limits Gait velocity: good speed Stairs: Yes Stairs Assistance: 5: Supervision Stairs Assistance Details (indicate cue type and reason): supervision for cues for gt sequencing and safety  Stair Management Technique: Two rails;Step to pattern;Forwards Number of Stairs: 5 Wheelchair Mobility Wheelchair Mobility: No         PT Diagnosis:    PT Problem List:   PT Treatment Interventions:     PT Goals (current goals can now be found in the care plan section) Acute Rehab PT Goals Patient Stated Goal: home today  PT Goal Formulation: With patient Time For Goal Achievement: 05/26/13 Potential to Achieve Goals: Good  Visit Information  Last PT Received On: 05/21/13 Assistance Needed: +1 History of Present Illness: L3-5 DECOMPRESSION TLIF L4-L5 POSTERIOR SPINAL FUSION INTERBODY L4-S1  (N/A    Subjective Data  Subjective: pt standing in room with father present; agreeable to amb hallway and step Patient Stated Goal: home today    Cognition  Cognition Arousal/Alertness: Awake/alert Behavior During Therapy: WFL for tasks assessed/performed Overall Cognitive Status: Within Functional Limits for tasks assessed    Balance  Balance Balance Assessed: No  End of Session PT - End of Session Equipment Utilized During Treatment: Back brace Activity Tolerance: Patient tolerated treatment well Patient left: in bed;with call bell/phone within reach;with family/visitor present Nurse Communication: Mobility status   GP     Donell Sievert, [place] [phone] 05/21/2013, 11:32 AM
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
## 2013-05-21 NOTE — Progress Notes (Signed)
    Subjective: Procedure(s) (LRB): L3-5 DECOMPRESSION TLIF L4-L5 POSTERIOR SPINAL FUSION INTERBODY L4-S1  (N/A) 4 Days Post-Op  Patient reports pain as 2 on 0-10 scale.  Reports decreased leg pain reports incisional back pain   Positive void Positive bowel movement Positive flatus Negative chest pain or shortness of breath  Objective: Vital signs in last 24 hours: Temp:  [97.3 F (36.3 C)-98.3 F (36.8 C)] 98.3 F (36.8 C) (08/18 0551) Pulse Rate:  [65-96] 72 (08/18 0551) Resp:  [14-18] 18 (08/18 0551) BP: (100-125)/(57-99) 125/75 mmHg (08/18 0551) SpO2:  [97 %-100 %] 100 % (08/18 0551)  Intake/Output from previous day: 08/17 0701 - 08/18 0700 In: 1160 [P.O.:780; I.V.:380] Out: 300 [Urine:300]  Labs:  Recent Labs  05/19/13 0353  WBC 8.8  RBC 3.13*  HCT 26.0*  PLT 141*    Recent Labs  05/19/13 0353  NA 139  K 3.7  CL 105  CO2 28  BUN 12  CREATININE 0.43*  GLUCOSE 119*  CALCIUM 8.4   No results found for this basename: LABPT, INR,  in the last 72 hours  Physical Exam: Neurologically intact ABD soft Neurovascular intact Incision: dressing C/D/I and no drainage Compartment soft  Assessment/Plan: Patient stable  xrays satisfactory Continue mobilization with physical therapy Continue care  Up with therapy Plan on d/c to home today Doing well F/u 2 weeks PCP has provided her with pain meds - will not require any at time of D/C No pulmonary/breathing issues s/p extubation.  Venita Lick, MD [facility] [phone]

## 2013-05-21 NOTE — Progress Notes (Signed)
Discharge instructions reviewed with patient and family member, all questions answered. Prescriptions given, patient wheeled down with volunteer services.

## 2013-06-13 ENCOUNTER — Other Ambulatory Visit (HOSPITAL_COMMUNITY): Payer: Self-pay | Admitting: Pulmonary Disease

## 2013-06-13 DIAGNOSIS — R609 Edema, unspecified: Secondary | ICD-10-CM

## 2013-06-13 DIAGNOSIS — R52 Pain, unspecified: Secondary | ICD-10-CM

## 2013-06-14 ENCOUNTER — Ambulatory Visit (HOSPITAL_COMMUNITY)
Admission: RE | Admit: 2013-06-14 | Discharge: 2013-06-14 | Disposition: A | Discharge status: Home / Self Care | Payer: Medicare Other | Source: Ambulatory Visit | Attending: Pulmonary Disease | Admitting: Pulmonary Disease

## 2013-06-14 DIAGNOSIS — R52 Pain, unspecified: Secondary | ICD-10-CM

## 2013-06-14 DIAGNOSIS — M79609 Pain in unspecified limb: Secondary | ICD-10-CM | POA: Insufficient documentation

## 2013-06-14 DIAGNOSIS — R609 Edema, unspecified: Secondary | ICD-10-CM

## 2014-05-09 ENCOUNTER — Other Ambulatory Visit (HOSPITAL_COMMUNITY): Payer: Self-pay | Admitting: Orthopedic Surgery

## 2014-05-09 DIAGNOSIS — M545 Low back pain, unspecified: Secondary | ICD-10-CM

## 2014-05-10 ENCOUNTER — Ambulatory Visit (HOSPITAL_COMMUNITY)
Admission: RE | Admit: 2014-05-10 | Discharge: 2014-05-10 | Disposition: A | Discharge status: Home / Self Care | Payer: Medicare Other | Source: Ambulatory Visit | Attending: Orthopedic Surgery | Admitting: Orthopedic Surgery

## 2014-05-10 DIAGNOSIS — Q762 Congenital spondylolisthesis: Secondary | ICD-10-CM | POA: Diagnosis not present

## 2014-05-10 DIAGNOSIS — M545 Low back pain, unspecified: Secondary | ICD-10-CM

## 2014-05-10 DIAGNOSIS — M549 Dorsalgia, unspecified: Secondary | ICD-10-CM | POA: Diagnosis present

## 2014-05-10 DIAGNOSIS — M949 Disorder of cartilage, unspecified: Secondary | ICD-10-CM

## 2014-05-10 DIAGNOSIS — M899 Disorder of bone, unspecified: Secondary | ICD-10-CM | POA: Diagnosis not present

## 2014-11-20 DIAGNOSIS — G894 Chronic pain syndrome: Secondary | ICD-10-CM | POA: Diagnosis not present

## 2014-11-20 DIAGNOSIS — R21 Rash and other nonspecific skin eruption: Secondary | ICD-10-CM | POA: Diagnosis not present

## 2014-11-20 DIAGNOSIS — M1990 Unspecified osteoarthritis, unspecified site: Secondary | ICD-10-CM | POA: Diagnosis not present

## 2014-12-20 DIAGNOSIS — M9902 Segmental and somatic dysfunction of thoracic region: Secondary | ICD-10-CM | POA: Diagnosis not present

## 2014-12-20 DIAGNOSIS — M542 Cervicalgia: Secondary | ICD-10-CM | POA: Diagnosis not present

## 2014-12-20 DIAGNOSIS — M546 Pain in thoracic spine: Secondary | ICD-10-CM | POA: Diagnosis not present

## 2014-12-20 DIAGNOSIS — M9901 Segmental and somatic dysfunction of cervical region: Secondary | ICD-10-CM | POA: Diagnosis not present

## 2014-12-23 DIAGNOSIS — M542 Cervicalgia: Secondary | ICD-10-CM | POA: Diagnosis not present

## 2014-12-23 DIAGNOSIS — M546 Pain in thoracic spine: Secondary | ICD-10-CM | POA: Diagnosis not present

## 2014-12-23 DIAGNOSIS — M9901 Segmental and somatic dysfunction of cervical region: Secondary | ICD-10-CM | POA: Diagnosis not present

## 2014-12-23 DIAGNOSIS — M9902 Segmental and somatic dysfunction of thoracic region: Secondary | ICD-10-CM | POA: Diagnosis not present

## 2014-12-26 DIAGNOSIS — M546 Pain in thoracic spine: Secondary | ICD-10-CM | POA: Diagnosis not present

## 2014-12-26 DIAGNOSIS — M542 Cervicalgia: Secondary | ICD-10-CM | POA: Diagnosis not present

## 2014-12-26 DIAGNOSIS — M9902 Segmental and somatic dysfunction of thoracic region: Secondary | ICD-10-CM | POA: Diagnosis not present

## 2014-12-26 DIAGNOSIS — M9901 Segmental and somatic dysfunction of cervical region: Secondary | ICD-10-CM | POA: Diagnosis not present

## 2014-12-30 DIAGNOSIS — G894 Chronic pain syndrome: Secondary | ICD-10-CM | POA: Diagnosis not present

## 2014-12-30 DIAGNOSIS — J209 Acute bronchitis, unspecified: Secondary | ICD-10-CM | POA: Diagnosis not present

## 2014-12-31 DIAGNOSIS — M9902 Segmental and somatic dysfunction of thoracic region: Secondary | ICD-10-CM | POA: Diagnosis not present

## 2014-12-31 DIAGNOSIS — M542 Cervicalgia: Secondary | ICD-10-CM | POA: Diagnosis not present

## 2014-12-31 DIAGNOSIS — M546 Pain in thoracic spine: Secondary | ICD-10-CM | POA: Diagnosis not present

## 2014-12-31 DIAGNOSIS — M9901 Segmental and somatic dysfunction of cervical region: Secondary | ICD-10-CM | POA: Diagnosis not present

## 2015-02-13 DIAGNOSIS — J449 Chronic obstructive pulmonary disease, unspecified: Secondary | ICD-10-CM | POA: Diagnosis not present

## 2015-02-13 DIAGNOSIS — J209 Acute bronchitis, unspecified: Secondary | ICD-10-CM | POA: Diagnosis not present

## 2015-04-17 DIAGNOSIS — R0602 Shortness of breath: Secondary | ICD-10-CM | POA: Diagnosis not present

## 2015-04-17 DIAGNOSIS — G894 Chronic pain syndrome: Secondary | ICD-10-CM | POA: Diagnosis not present

## 2015-04-17 DIAGNOSIS — R499 Unspecified voice and resonance disorder: Secondary | ICD-10-CM | POA: Diagnosis not present

## 2015-04-17 DIAGNOSIS — R05 Cough: Secondary | ICD-10-CM | POA: Diagnosis not present

## 2015-04-17 DIAGNOSIS — D649 Anemia, unspecified: Secondary | ICD-10-CM | POA: Diagnosis not present

## 2015-04-18 ENCOUNTER — Other Ambulatory Visit (HOSPITAL_COMMUNITY): Payer: Self-pay | Admitting: Pulmonary Disease

## 2015-04-18 ENCOUNTER — Ambulatory Visit (HOSPITAL_COMMUNITY)
Admission: RE | Admit: 2015-04-18 | Discharge: 2015-04-18 | Disposition: A | Discharge status: Home / Self Care | Payer: Medicare Other | Source: Ambulatory Visit | Attending: Pulmonary Disease | Admitting: Pulmonary Disease

## 2015-04-18 DIAGNOSIS — R059 Cough, unspecified: Secondary | ICD-10-CM

## 2015-04-18 DIAGNOSIS — R05 Cough: Secondary | ICD-10-CM | POA: Insufficient documentation

## 2015-04-18 DIAGNOSIS — R0602 Shortness of breath: Secondary | ICD-10-CM | POA: Diagnosis not present

## 2015-04-18 DIAGNOSIS — R490 Dysphonia: Secondary | ICD-10-CM | POA: Insufficient documentation

## 2015-05-19 DIAGNOSIS — Z0000 Encounter for general adult medical examination without abnormal findings: Secondary | ICD-10-CM | POA: Diagnosis not present

## 2015-06-13 DIAGNOSIS — Z1211 Encounter for screening for malignant neoplasm of colon: Secondary | ICD-10-CM | POA: Diagnosis not present

## 2015-06-13 LAB — FECAL OCCULT BLOOD, GUAIAC: Fecal Occult Blood: NEGATIVE

## 2015-08-19 DIAGNOSIS — R499 Unspecified voice and resonance disorder: Secondary | ICD-10-CM | POA: Diagnosis not present

## 2015-08-19 DIAGNOSIS — F909 Attention-deficit hyperactivity disorder, unspecified type: Secondary | ICD-10-CM | POA: Diagnosis not present

## 2015-08-19 DIAGNOSIS — M1990 Unspecified osteoarthritis, unspecified site: Secondary | ICD-10-CM | POA: Diagnosis not present

## 2015-08-19 DIAGNOSIS — M545 Low back pain: Secondary | ICD-10-CM | POA: Diagnosis not present

## 2015-08-19 DIAGNOSIS — Z23 Encounter for immunization: Secondary | ICD-10-CM | POA: Diagnosis not present

## 2015-11-10 DIAGNOSIS — R490 Dysphonia: Secondary | ICD-10-CM | POA: Diagnosis not present

## 2015-11-10 DIAGNOSIS — J384 Edema of larynx: Secondary | ICD-10-CM | POA: Diagnosis not present

## 2015-11-10 DIAGNOSIS — J3802 Paralysis of vocal cords and larynx, bilateral: Secondary | ICD-10-CM | POA: Diagnosis not present

## 2016-03-04 DIAGNOSIS — Z79891 Long term (current) use of opiate analgesic: Secondary | ICD-10-CM | POA: Diagnosis not present

## 2016-04-14 DIAGNOSIS — R609 Edema, unspecified: Secondary | ICD-10-CM | POA: Diagnosis not present

## 2016-04-14 DIAGNOSIS — E039 Hypothyroidism, unspecified: Secondary | ICD-10-CM | POA: Diagnosis not present

## 2016-04-14 DIAGNOSIS — G894 Chronic pain syndrome: Secondary | ICD-10-CM | POA: Diagnosis not present

## 2016-04-14 DIAGNOSIS — L03116 Cellulitis of left lower limb: Secondary | ICD-10-CM | POA: Diagnosis not present

## 2016-04-15 DIAGNOSIS — R609 Edema, unspecified: Secondary | ICD-10-CM | POA: Diagnosis not present

## 2016-04-15 DIAGNOSIS — G894 Chronic pain syndrome: Secondary | ICD-10-CM | POA: Diagnosis not present

## 2016-04-15 DIAGNOSIS — L03116 Cellulitis of left lower limb: Secondary | ICD-10-CM | POA: Diagnosis not present

## 2016-04-15 DIAGNOSIS — E039 Hypothyroidism, unspecified: Secondary | ICD-10-CM | POA: Diagnosis not present

## 2016-04-30 DIAGNOSIS — S81801A Unspecified open wound, right lower leg, initial encounter: Secondary | ICD-10-CM | POA: Diagnosis not present

## 2016-11-01 DIAGNOSIS — M1990 Unspecified osteoarthritis, unspecified site: Secondary | ICD-10-CM | POA: Diagnosis not present

## 2016-11-01 DIAGNOSIS — G894 Chronic pain syndrome: Secondary | ICD-10-CM | POA: Diagnosis not present

## 2017-01-11 DIAGNOSIS — M545 Low back pain: Secondary | ICD-10-CM | POA: Diagnosis not present

## 2017-01-11 DIAGNOSIS — E039 Hypothyroidism, unspecified: Secondary | ICD-10-CM | POA: Diagnosis not present

## 2017-01-11 DIAGNOSIS — R499 Unspecified voice and resonance disorder: Secondary | ICD-10-CM | POA: Diagnosis not present

## 2017-01-11 DIAGNOSIS — K5909 Other constipation: Secondary | ICD-10-CM | POA: Diagnosis not present

## 2017-03-03 DIAGNOSIS — G894 Chronic pain syndrome: Secondary | ICD-10-CM | POA: Diagnosis not present

## 2017-03-03 DIAGNOSIS — K5909 Other constipation: Secondary | ICD-10-CM | POA: Diagnosis not present

## 2017-03-03 DIAGNOSIS — M1990 Unspecified osteoarthritis, unspecified site: Secondary | ICD-10-CM | POA: Diagnosis not present

## 2017-03-03 DIAGNOSIS — R499 Unspecified voice and resonance disorder: Secondary | ICD-10-CM | POA: Diagnosis not present

## 2017-06-16 DIAGNOSIS — I872 Venous insufficiency (chronic) (peripheral): Secondary | ICD-10-CM | POA: Diagnosis not present

## 2017-06-16 DIAGNOSIS — R609 Edema, unspecified: Secondary | ICD-10-CM | POA: Diagnosis not present

## 2017-06-16 DIAGNOSIS — E039 Hypothyroidism, unspecified: Secondary | ICD-10-CM | POA: Diagnosis not present

## 2017-06-16 DIAGNOSIS — G894 Chronic pain syndrome: Secondary | ICD-10-CM | POA: Diagnosis not present

## 2017-06-27 DIAGNOSIS — R069 Unspecified abnormalities of breathing: Secondary | ICD-10-CM | POA: Diagnosis not present

## 2017-06-27 DIAGNOSIS — I872 Venous insufficiency (chronic) (peripheral): Secondary | ICD-10-CM | POA: Diagnosis not present

## 2017-06-27 DIAGNOSIS — E039 Hypothyroidism, unspecified: Secondary | ICD-10-CM | POA: Diagnosis not present

## 2017-06-27 DIAGNOSIS — G894 Chronic pain syndrome: Secondary | ICD-10-CM | POA: Diagnosis not present

## 2017-09-15 DIAGNOSIS — E039 Hypothyroidism, unspecified: Secondary | ICD-10-CM | POA: Diagnosis not present

## 2017-09-15 DIAGNOSIS — I872 Venous insufficiency (chronic) (peripheral): Secondary | ICD-10-CM | POA: Diagnosis not present

## 2017-09-15 DIAGNOSIS — G894 Chronic pain syndrome: Secondary | ICD-10-CM | POA: Diagnosis not present

## 2017-09-15 DIAGNOSIS — M1990 Unspecified osteoarthritis, unspecified site: Secondary | ICD-10-CM | POA: Diagnosis not present

## 2017-12-28 DIAGNOSIS — Z79891 Long term (current) use of opiate analgesic: Secondary | ICD-10-CM | POA: Diagnosis not present

## 2017-12-28 DIAGNOSIS — M545 Low back pain: Secondary | ICD-10-CM | POA: Diagnosis not present

## 2017-12-28 DIAGNOSIS — G894 Chronic pain syndrome: Secondary | ICD-10-CM | POA: Diagnosis not present

## 2017-12-28 DIAGNOSIS — R499 Unspecified voice and resonance disorder: Secondary | ICD-10-CM | POA: Diagnosis not present

## 2017-12-28 DIAGNOSIS — E039 Hypothyroidism, unspecified: Secondary | ICD-10-CM | POA: Diagnosis not present

## 2017-12-30 ENCOUNTER — Encounter: Payer: Self-pay | Admitting: Family Medicine

## 2017-12-30 DIAGNOSIS — E039 Hypothyroidism, unspecified: Secondary | ICD-10-CM | POA: Diagnosis not present

## 2017-12-30 DIAGNOSIS — G894 Chronic pain syndrome: Secondary | ICD-10-CM | POA: Diagnosis not present

## 2017-12-30 DIAGNOSIS — M545 Low back pain: Secondary | ICD-10-CM | POA: Diagnosis not present

## 2017-12-30 DIAGNOSIS — R499 Unspecified voice and resonance disorder: Secondary | ICD-10-CM | POA: Diagnosis not present

## 2018-02-09 DIAGNOSIS — H35413 Lattice degeneration of retina, bilateral: Secondary | ICD-10-CM | POA: Diagnosis not present

## 2018-03-30 DIAGNOSIS — M1990 Unspecified osteoarthritis, unspecified site: Secondary | ICD-10-CM | POA: Diagnosis not present

## 2018-03-30 DIAGNOSIS — G894 Chronic pain syndrome: Secondary | ICD-10-CM | POA: Diagnosis not present

## 2018-03-30 DIAGNOSIS — I872 Venous insufficiency (chronic) (peripheral): Secondary | ICD-10-CM | POA: Diagnosis not present

## 2018-03-30 DIAGNOSIS — E039 Hypothyroidism, unspecified: Secondary | ICD-10-CM | POA: Diagnosis not present

## 2018-06-19 DIAGNOSIS — I872 Venous insufficiency (chronic) (peripheral): Secondary | ICD-10-CM | POA: Diagnosis not present

## 2018-06-19 DIAGNOSIS — M1990 Unspecified osteoarthritis, unspecified site: Secondary | ICD-10-CM | POA: Diagnosis not present

## 2018-06-19 DIAGNOSIS — R499 Unspecified voice and resonance disorder: Secondary | ICD-10-CM | POA: Diagnosis not present

## 2018-06-19 DIAGNOSIS — G894 Chronic pain syndrome: Secondary | ICD-10-CM | POA: Diagnosis not present

## 2018-07-25 DIAGNOSIS — M9901 Segmental and somatic dysfunction of cervical region: Secondary | ICD-10-CM | POA: Diagnosis not present

## 2018-07-25 DIAGNOSIS — M546 Pain in thoracic spine: Secondary | ICD-10-CM | POA: Diagnosis not present

## 2018-07-25 DIAGNOSIS — M542 Cervicalgia: Secondary | ICD-10-CM | POA: Diagnosis not present

## 2018-07-25 DIAGNOSIS — M9902 Segmental and somatic dysfunction of thoracic region: Secondary | ICD-10-CM | POA: Diagnosis not present

## 2018-07-25 DIAGNOSIS — M9903 Segmental and somatic dysfunction of lumbar region: Secondary | ICD-10-CM | POA: Diagnosis not present

## 2018-07-27 DIAGNOSIS — M542 Cervicalgia: Secondary | ICD-10-CM | POA: Diagnosis not present

## 2018-07-27 DIAGNOSIS — M9901 Segmental and somatic dysfunction of cervical region: Secondary | ICD-10-CM | POA: Diagnosis not present

## 2018-07-27 DIAGNOSIS — M9902 Segmental and somatic dysfunction of thoracic region: Secondary | ICD-10-CM | POA: Diagnosis not present

## 2018-07-27 DIAGNOSIS — M546 Pain in thoracic spine: Secondary | ICD-10-CM | POA: Diagnosis not present

## 2018-07-27 DIAGNOSIS — M9903 Segmental and somatic dysfunction of lumbar region: Secondary | ICD-10-CM | POA: Diagnosis not present

## 2018-07-31 DIAGNOSIS — M9903 Segmental and somatic dysfunction of lumbar region: Secondary | ICD-10-CM | POA: Diagnosis not present

## 2018-07-31 DIAGNOSIS — M542 Cervicalgia: Secondary | ICD-10-CM | POA: Diagnosis not present

## 2018-07-31 DIAGNOSIS — M9901 Segmental and somatic dysfunction of cervical region: Secondary | ICD-10-CM | POA: Diagnosis not present

## 2018-07-31 DIAGNOSIS — M9902 Segmental and somatic dysfunction of thoracic region: Secondary | ICD-10-CM | POA: Diagnosis not present

## 2018-07-31 DIAGNOSIS — M546 Pain in thoracic spine: Secondary | ICD-10-CM | POA: Diagnosis not present

## 2018-08-08 DIAGNOSIS — M9902 Segmental and somatic dysfunction of thoracic region: Secondary | ICD-10-CM | POA: Diagnosis not present

## 2018-08-08 DIAGNOSIS — M542 Cervicalgia: Secondary | ICD-10-CM | POA: Diagnosis not present

## 2018-08-08 DIAGNOSIS — M9903 Segmental and somatic dysfunction of lumbar region: Secondary | ICD-10-CM | POA: Diagnosis not present

## 2018-08-08 DIAGNOSIS — M546 Pain in thoracic spine: Secondary | ICD-10-CM | POA: Diagnosis not present

## 2018-08-08 DIAGNOSIS — M9901 Segmental and somatic dysfunction of cervical region: Secondary | ICD-10-CM | POA: Diagnosis not present

## 2018-09-18 DIAGNOSIS — E039 Hypothyroidism, unspecified: Secondary | ICD-10-CM | POA: Diagnosis not present

## 2018-09-18 DIAGNOSIS — M1990 Unspecified osteoarthritis, unspecified site: Secondary | ICD-10-CM | POA: Diagnosis not present

## 2018-09-18 DIAGNOSIS — R499 Unspecified voice and resonance disorder: Secondary | ICD-10-CM | POA: Diagnosis not present

## 2018-09-18 DIAGNOSIS — G894 Chronic pain syndrome: Secondary | ICD-10-CM | POA: Diagnosis not present

## 2018-10-20 DIAGNOSIS — M9903 Segmental and somatic dysfunction of lumbar region: Secondary | ICD-10-CM | POA: Diagnosis not present

## 2018-10-20 DIAGNOSIS — M9901 Segmental and somatic dysfunction of cervical region: Secondary | ICD-10-CM | POA: Diagnosis not present

## 2018-10-20 DIAGNOSIS — M546 Pain in thoracic spine: Secondary | ICD-10-CM | POA: Diagnosis not present

## 2018-10-20 DIAGNOSIS — M545 Low back pain: Secondary | ICD-10-CM | POA: Diagnosis not present

## 2018-10-20 DIAGNOSIS — M9902 Segmental and somatic dysfunction of thoracic region: Secondary | ICD-10-CM | POA: Diagnosis not present

## 2018-10-25 DIAGNOSIS — M545 Low back pain: Secondary | ICD-10-CM | POA: Diagnosis not present

## 2018-10-25 DIAGNOSIS — M9901 Segmental and somatic dysfunction of cervical region: Secondary | ICD-10-CM | POA: Diagnosis not present

## 2018-10-25 DIAGNOSIS — M9903 Segmental and somatic dysfunction of lumbar region: Secondary | ICD-10-CM | POA: Diagnosis not present

## 2018-10-25 DIAGNOSIS — M9902 Segmental and somatic dysfunction of thoracic region: Secondary | ICD-10-CM | POA: Diagnosis not present

## 2018-10-25 DIAGNOSIS — M546 Pain in thoracic spine: Secondary | ICD-10-CM | POA: Diagnosis not present

## 2018-10-27 DIAGNOSIS — M545 Low back pain: Secondary | ICD-10-CM | POA: Diagnosis not present

## 2018-11-02 DIAGNOSIS — M9903 Segmental and somatic dysfunction of lumbar region: Secondary | ICD-10-CM | POA: Diagnosis not present

## 2018-11-02 DIAGNOSIS — M546 Pain in thoracic spine: Secondary | ICD-10-CM | POA: Diagnosis not present

## 2018-11-02 DIAGNOSIS — M9902 Segmental and somatic dysfunction of thoracic region: Secondary | ICD-10-CM | POA: Diagnosis not present

## 2018-11-02 DIAGNOSIS — M545 Low back pain: Secondary | ICD-10-CM | POA: Diagnosis not present

## 2018-11-02 DIAGNOSIS — M9901 Segmental and somatic dysfunction of cervical region: Secondary | ICD-10-CM | POA: Diagnosis not present

## 2018-12-18 DIAGNOSIS — G894 Chronic pain syndrome: Secondary | ICD-10-CM | POA: Diagnosis not present

## 2018-12-18 DIAGNOSIS — R609 Edema, unspecified: Secondary | ICD-10-CM | POA: Diagnosis not present

## 2018-12-18 DIAGNOSIS — E039 Hypothyroidism, unspecified: Secondary | ICD-10-CM | POA: Diagnosis not present

## 2018-12-18 DIAGNOSIS — M1990 Unspecified osteoarthritis, unspecified site: Secondary | ICD-10-CM | POA: Diagnosis not present

## 2019-03-27 DIAGNOSIS — M1990 Unspecified osteoarthritis, unspecified site: Secondary | ICD-10-CM | POA: Diagnosis not present

## 2019-03-27 DIAGNOSIS — G894 Chronic pain syndrome: Secondary | ICD-10-CM | POA: Diagnosis not present

## 2019-03-27 DIAGNOSIS — J301 Allergic rhinitis due to pollen: Secondary | ICD-10-CM | POA: Diagnosis not present

## 2019-03-27 DIAGNOSIS — E039 Hypothyroidism, unspecified: Secondary | ICD-10-CM | POA: Diagnosis not present

## 2019-04-27 DIAGNOSIS — M1990 Unspecified osteoarthritis, unspecified site: Secondary | ICD-10-CM | POA: Diagnosis not present

## 2019-04-27 DIAGNOSIS — E039 Hypothyroidism, unspecified: Secondary | ICD-10-CM | POA: Diagnosis not present

## 2019-04-27 DIAGNOSIS — J301 Allergic rhinitis due to pollen: Secondary | ICD-10-CM | POA: Diagnosis not present

## 2019-04-27 DIAGNOSIS — G894 Chronic pain syndrome: Secondary | ICD-10-CM | POA: Diagnosis not present

## 2019-04-27 LAB — LIPID PANEL
Cholesterol: 198 (ref 0–200)
HDL: 60 (ref 35–70)
LDL Cholesterol: 117
Triglycerides: 105 (ref 40–160)

## 2019-04-27 LAB — BASIC METABOLIC PANEL
BUN: 13 (ref 4–21)
CO2: 24 — AB (ref 13–22)
Chloride: 102 (ref 99–108)
Creatinine: 0.7 (ref <=1.1)
Glucose: 81
Potassium: 4.6 (ref 3.4–5.3)
Sodium: 141 (ref 137–147)

## 2019-04-27 LAB — CBC AND DIFFERENTIAL
HCT: 44 (ref 36–46)
Hemoglobin: 15.2 (ref 12.0–16.0)
Platelets: 178 (ref 150–399)
WBC: 4.9

## 2019-04-27 LAB — TSH: TSH: 1.36 (ref <=5.90)

## 2019-04-27 LAB — CBC: RBC: 4.91 (ref 3.87–5.11)

## 2019-04-27 LAB — HEPATIC FUNCTION PANEL
ALT: 18 (ref 7–35)
AST: 24 (ref 13–35)
Alkaline Phosphatase: 98 (ref 25–125)

## 2019-04-27 LAB — COMPREHENSIVE METABOLIC PANEL
Albumin: 4.5 (ref 3.5–5.0)
Calcium: 9 (ref 8.7–10.7)
GFR calc Af Amer: 103
GFR calc non Af Amer: 89

## 2019-05-22 ENCOUNTER — Other Ambulatory Visit (HOSPITAL_COMMUNITY): Payer: Self-pay | Admitting: Pulmonary Disease

## 2019-05-22 DIAGNOSIS — Z0000 Encounter for general adult medical examination without abnormal findings: Secondary | ICD-10-CM | POA: Diagnosis not present

## 2019-05-23 ENCOUNTER — Other Ambulatory Visit (HOSPITAL_COMMUNITY): Payer: Self-pay | Admitting: Pulmonary Disease

## 2019-05-23 DIAGNOSIS — M159 Polyosteoarthritis, unspecified: Secondary | ICD-10-CM

## 2019-05-30 ENCOUNTER — Ambulatory Visit (HOSPITAL_COMMUNITY)
Admission: RE | Admit: 2019-05-30 | Discharge: 2019-05-30 | Disposition: A | Discharge status: Home / Self Care | Payer: Medicare Other | Source: Ambulatory Visit | Attending: Pulmonary Disease | Admitting: Pulmonary Disease

## 2019-05-30 ENCOUNTER — Other Ambulatory Visit: Payer: Self-pay

## 2019-05-30 ENCOUNTER — Other Ambulatory Visit (HOSPITAL_COMMUNITY): Payer: Self-pay | Admitting: Pulmonary Disease

## 2019-05-30 DIAGNOSIS — M810 Age-related osteoporosis without current pathological fracture: Secondary | ICD-10-CM | POA: Diagnosis not present

## 2019-05-30 DIAGNOSIS — M159 Polyosteoarthritis, unspecified: Secondary | ICD-10-CM | POA: Diagnosis not present

## 2019-05-30 DIAGNOSIS — Z780 Asymptomatic menopausal state: Secondary | ICD-10-CM | POA: Diagnosis not present

## 2019-05-30 LAB — HM DEXA SCAN

## 2019-06-12 DIAGNOSIS — H2513 Age-related nuclear cataract, bilateral: Secondary | ICD-10-CM | POA: Diagnosis not present

## 2019-08-20 DIAGNOSIS — R52 Pain, unspecified: Secondary | ICD-10-CM | POA: Diagnosis not present

## 2019-08-20 DIAGNOSIS — J301 Allergic rhinitis due to pollen: Secondary | ICD-10-CM | POA: Diagnosis not present

## 2019-08-20 DIAGNOSIS — E039 Hypothyroidism, unspecified: Secondary | ICD-10-CM | POA: Diagnosis not present

## 2019-08-20 DIAGNOSIS — Z23 Encounter for immunization: Secondary | ICD-10-CM | POA: Diagnosis not present

## 2019-08-20 DIAGNOSIS — R499 Unspecified voice and resonance disorder: Secondary | ICD-10-CM | POA: Diagnosis not present

## 2019-08-30 DIAGNOSIS — D649 Anemia, unspecified: Secondary | ICD-10-CM | POA: Insufficient documentation

## 2019-08-30 DIAGNOSIS — E039 Hypothyroidism, unspecified: Secondary | ICD-10-CM

## 2019-08-30 DIAGNOSIS — R52 Pain, unspecified: Secondary | ICD-10-CM

## 2019-08-30 DIAGNOSIS — I872 Venous insufficiency (chronic) (peripheral): Secondary | ICD-10-CM

## 2019-08-30 DIAGNOSIS — F419 Anxiety disorder, unspecified: Secondary | ICD-10-CM

## 2019-08-30 DIAGNOSIS — M1990 Unspecified osteoarthritis, unspecified site: Secondary | ICD-10-CM | POA: Insufficient documentation

## 2019-08-30 DIAGNOSIS — R609 Edema, unspecified: Secondary | ICD-10-CM

## 2019-08-30 DIAGNOSIS — R499 Unspecified voice and resonance disorder: Secondary | ICD-10-CM

## 2019-08-30 DIAGNOSIS — F411 Generalized anxiety disorder: Secondary | ICD-10-CM | POA: Insufficient documentation

## 2019-08-30 DIAGNOSIS — G894 Chronic pain syndrome: Secondary | ICD-10-CM | POA: Insufficient documentation

## 2019-09-10 ENCOUNTER — Ambulatory Visit (INDEPENDENT_AMBULATORY_CARE_PROVIDER_SITE_OTHER): Payer: Medicare Other

## 2019-09-10 ENCOUNTER — Ambulatory Visit: Payer: Medicare Other | Admitting: Internal Medicine

## 2019-09-10 ENCOUNTER — Other Ambulatory Visit: Payer: Self-pay

## 2019-09-10 ENCOUNTER — Encounter: Payer: Self-pay | Admitting: Internal Medicine

## 2019-09-10 DIAGNOSIS — R0600 Dyspnea, unspecified: Secondary | ICD-10-CM

## 2019-09-10 DIAGNOSIS — R0609 Other forms of dyspnea: Secondary | ICD-10-CM | POA: Diagnosis not present

## 2019-09-10 DIAGNOSIS — Z87891 Personal history of nicotine dependence: Secondary | ICD-10-CM

## 2019-09-10 NOTE — Patient Instructions (Addendum)
Low-dose CT lung cancer screening is recommended for patients who are 19-59 years of age with a 30+ pack-year history of smoking, and who are currently smoking or quit <=15 years ago  > call in spring to start the program Eric Form NP)  Please remember to go to the  x-ray department  for your tests - we will call you with the results when they are available    We will set you up for PFTs at [facility]   Pulmonary follow up can be as needed

## 2019-09-10 NOTE — Progress Notes (Signed)
LMTCB
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
## 2019-09-11 ENCOUNTER — Telehealth: Payer: Self-pay | Admitting: *Deleted

## 2019-09-11 ENCOUNTER — Encounter: Payer: Self-pay | Admitting: Internal Medicine

## 2019-09-11 DIAGNOSIS — Z87891 Personal history of nicotine dependence: Secondary | ICD-10-CM | POA: Insufficient documentation

## 2019-09-11 DIAGNOSIS — R0609 Other forms of dyspnea: Secondary | ICD-10-CM

## 2019-09-11 NOTE — Assessment & Plan Note (Signed)
Quit smoking 12/2018 - rec LDSCT to start p covid 19 restrictions lifted   Congratulated on d/c cigs.   I reviewed the Fletcher curve with the patient that basically indicates  if you quit smoking when your best day FEV1 is still well preserved (as is clearly  the case here)  it is highly unlikely you will progress to severe disease and informed the patient there was  no medication on the market that has proven to alter the curve/ its downward trajectory  or the likelihood of progression of their disease(unlike other chronic medical conditions such as atheroclerosis where we do think we can change the natural hx with risk reducing meds)    Therefore stopping smoking and maintaining abstinence are  the most important aspects of care, not choice of inhalers or for that matter, doctors.   Treatment other than smoking cessation  is entirely directed by severity of symptoms and focused also on reducing exacerbations, not attempting to change the natural history of the disease.     Total time devoted to counseling  > 50 % of initial 60 min office visit:  review case with pt/ discussion of options/alternatives/ personally creating written customized instructions  in presence of pt  then going over those specific  Instructions directly with the pt including how to use all of the meds but in particular covering each new medication in detail and the difference between the maintenance= "automatic" meds and the prns using an action plan format for the latter (If this problem/symptom => do that organization reading Left to right).  Please see AVS from this visit for a full list of these instructions which I personally wrote for this pt and  are unique to this visit.

## 2019-09-11 NOTE — Progress Notes (Signed)
Spoke with pt and notified of results per Dr. Wert. Pt verbalized understanding and denied any questions. 

## 2019-09-11 NOTE — Telephone Encounter (Signed)
-----   Message from Tanda Rockers, MD sent at 09/11/2019  8:34 AM EST ----- Needs full pfts at [facility] - no rush
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
## 2019-09-11 NOTE — Telephone Encounter (Signed)
Pt notified and PFT ordered at [facility]

## 2019-10-26 ENCOUNTER — Other Ambulatory Visit: Payer: Self-pay

## 2019-10-26 ENCOUNTER — Other Ambulatory Visit (HOSPITAL_COMMUNITY)
Admission: RE | Admit: 2019-10-26 | Discharge: 2019-10-26 | Disposition: A | Discharge status: Home / Self Care | Payer: Medicare Other | Source: Ambulatory Visit | Attending: Internal Medicine | Admitting: Internal Medicine

## 2019-10-26 DIAGNOSIS — Z01812 Encounter for preprocedural laboratory examination: Secondary | ICD-10-CM | POA: Diagnosis present

## 2019-10-26 DIAGNOSIS — Z20822 Contact with and (suspected) exposure to covid-19: Secondary | ICD-10-CM | POA: Diagnosis not present

## 2019-10-27 LAB — SARS CORONAVIRUS 2 (TAT 6-24 HRS): SARS Coronavirus 2: NEGATIVE

## 2019-10-30 NOTE — Progress Notes (Signed)
Spoke with pt and notified of results per Dr. Wert. Pt verbalized understanding and denied any questions. 

## 2019-10-31 ENCOUNTER — Ambulatory Visit (INDEPENDENT_AMBULATORY_CARE_PROVIDER_SITE_OTHER): Payer: Medicare Other | Admitting: Internal Medicine

## 2019-10-31 ENCOUNTER — Other Ambulatory Visit: Payer: Self-pay

## 2019-10-31 DIAGNOSIS — R0600 Dyspnea, unspecified: Secondary | ICD-10-CM | POA: Diagnosis not present

## 2019-10-31 DIAGNOSIS — R0609 Other forms of dyspnea: Secondary | ICD-10-CM

## 2019-10-31 LAB — PULMONARY FUNCTION TEST
DL/VA % pred: 122 %
DL/VA: 5.19 ml/min/mmHg/L
DLCO unc % pred: 110 %
DLCO unc: 21.71 ml/min/mmHg
FEF 25-75 Post: 1.41 L/sec
FEF 25-75 Pre: 1.64 L/sec
FEF2575-%Change-Post: -13 %
FEF2575-%Pred-Post: 60 %
FEF2575-%Pred-Pre: 70 %
FEV1-%Change-Post: -13 %
FEV1-%Pred-Post: 69 %
FEV1-%Pred-Pre: 80 %
FEV1-Post: 1.72 L
FEV1-Pre: 2 L
FEV1FVC-%Change-Post: -12 %
FEV1FVC-%Pred-Pre: 93 %
FEV6-%Change-Post: -1 %
FEV6-%Pred-Post: 86 %
FEV6-%Pred-Pre: 87 %
FEV6-Post: 2.7 L
FEV6-Pre: 2.73 L
FEV6FVC-%Pred-Post: 103 %
FEV6FVC-%Pred-Pre: 103 %
FVC-%Change-Post: -1 %
FVC-%Pred-Post: 83 %
FVC-%Pred-Pre: 84 %
FVC-Post: 2.7 L
FVC-Pre: 2.73 L
Post FEV1/FVC ratio: 64 %
Post FEV6/FVC ratio: 100 %
Pre FEV1/FVC ratio: 73 %
Pre FEV6/FVC Ratio: 100 %
RV % pred: 120 %
RV: 2.31 L
TLC % pred: 100 %
TLC: 4.91 L

## 2019-11-01 NOTE — Progress Notes (Signed)
Full PFT performed. 

## 2019-11-06 ENCOUNTER — Encounter: Payer: Self-pay | Admitting: Family Medicine

## 2019-11-06 ENCOUNTER — Other Ambulatory Visit: Payer: Self-pay

## 2019-11-06 ENCOUNTER — Ambulatory Visit (INDEPENDENT_AMBULATORY_CARE_PROVIDER_SITE_OTHER): Payer: Medicare Other | Admitting: Family Medicine

## 2019-11-06 VITALS — BP 148/82 | HR 66 | Temp 98.1°F | Ht 63.0 in | Wt 127.6 lb

## 2019-11-06 DIAGNOSIS — E039 Hypothyroidism, unspecified: Secondary | ICD-10-CM | POA: Diagnosis not present

## 2019-11-06 DIAGNOSIS — Z1231 Encounter for screening mammogram for malignant neoplasm of breast: Secondary | ICD-10-CM | POA: Diagnosis not present
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
## 2019-11-06 NOTE — Patient Instructions (Signed)
Mammogram

## 2019-11-11 DIAGNOSIS — Z1231 Encounter for screening mammogram for malignant neoplasm of breast: Secondary | ICD-10-CM | POA: Insufficient documentation

## 2019-11-14 ENCOUNTER — Telehealth: Payer: Self-pay | Admitting: Internal Medicine

## 2019-11-14 NOTE — Telephone Encounter (Signed)
Spoke with the pt  She states that the [facility] has still not received her DMV form  She states that they were supposed to have received this on 11/07/18 and if they don't get soon will close her case and she will have to be retested  She states that she brought this and gave to MW in a folderwhen she came in on 09/10/2019  I could not locate this unfortunately  It has not been scanned into her chart and is not on Dr Gustavus Bryant desk in A pod  Dr Melvyn Novas do you remember anything about this? Maybe on your desk upstairs?

## 2019-11-14 NOTE — Telephone Encounter (Signed)
I could not locate this? Are you referring to your desk upstairs?

## 2019-11-14 NOTE — Telephone Encounter (Signed)
I think it's in one of the top two drawers on L of my desk and I was saving it for pft results If we have another set you can just stemp it with my signature or I'll get to it in am 2/11

## 2019-11-14 NOTE — Telephone Encounter (Signed)
Yes I'll get it in am

## 2019-11-15 NOTE — Telephone Encounter (Signed)
Form done and faxed along with her PFT results to [facility]  I spoke with the pt and notified that this was done  Form placed in scan folder in [facility] to be sent to scan center  Nothing further needed

## 2019-11-21 ENCOUNTER — Ambulatory Visit (HOSPITAL_COMMUNITY)
Admission: RE | Admit: 2019-11-21 | Discharge: 2019-11-21 | Disposition: A | Discharge status: Home / Self Care | Payer: Medicare Other | Source: Ambulatory Visit | Attending: Family Medicine | Admitting: Family Medicine

## 2019-11-21 ENCOUNTER — Other Ambulatory Visit: Payer: Self-pay

## 2019-11-21 DIAGNOSIS — Z1231 Encounter for screening mammogram for malignant neoplasm of breast: Secondary | ICD-10-CM | POA: Insufficient documentation

## 2019-11-26 ENCOUNTER — Telehealth: Payer: Self-pay | Admitting: Emergency Medicine

## 2019-11-26 ENCOUNTER — Other Ambulatory Visit: Payer: Self-pay | Admitting: Family Medicine

## 2019-11-26 DIAGNOSIS — Z1231 Encounter for screening mammogram for malignant neoplasm of breast: Secondary | ICD-10-CM

## 2019-11-26 NOTE — Telephone Encounter (Signed)
-----   Message from Maryruth Hancock, MD sent at 11/26/2019  1:24 PM EST ----- Your mammogram is normal

## 2019-11-26 NOTE — Telephone Encounter (Signed)
Patient was informed that her mammogram was negative

## 2020-02-27 ENCOUNTER — Telehealth: Payer: Self-pay | Admitting: Family Medicine

## 2020-02-27 ENCOUNTER — Encounter: Payer: Self-pay | Admitting: Internal Medicine

## 2020-02-27 DIAGNOSIS — Z889 Allergy status to unspecified drugs, medicaments and biological substances status: Secondary | ICD-10-CM

## 2020-02-27 DIAGNOSIS — F419 Anxiety disorder, unspecified: Secondary | ICD-10-CM

## 2020-02-27 DIAGNOSIS — E039 Hypothyroidism, unspecified: Secondary | ICD-10-CM

## 2020-02-27 MED ORDER — SERTRALINE HCL 50 MG PO TABS: 50.0000 mg | ORAL_TABLET | Freq: Every day | ORAL | 1 refills | Status: DC

## 2020-02-27 MED ORDER — LEVOTHYROXINE SODIUM 125 MCG PO TABS: 125.0000 ug | ORAL_TABLET | Freq: Every day | ORAL | 1 refills | Status: DC

## 2020-02-27 MED ORDER — FLUTICASONE PROPIONATE 50 MCG/ACT NA SUSP: 2.0000 | Freq: Every day | NASAL | 1 refills | Status: DC

## 2020-02-27 NOTE — Telephone Encounter (Signed)
Patient is calling and requesting a refill on the following medications. Patient states she needs a 90 day supply since it is through mail service   fluticasone (FLONASE) 50 MCG/ACT nasal spray   levothyroxine (SYNTHROID) 125 MCG tablet    sertraline (ZOLOFT) 50 MG tablet    [place], [place] [employer] Phone:  [phone]  Fax:  [phone]

## 2020-03-18 DIAGNOSIS — E559 Vitamin D deficiency, unspecified: Secondary | ICD-10-CM | POA: Diagnosis not present

## 2020-03-18 DIAGNOSIS — Z79899 Other long term (current) drug therapy: Secondary | ICD-10-CM | POA: Diagnosis not present

## 2020-03-18 DIAGNOSIS — Z131 Encounter for screening for diabetes mellitus: Secondary | ICD-10-CM | POA: Diagnosis not present

## 2020-03-18 DIAGNOSIS — M129 Arthropathy, unspecified: Secondary | ICD-10-CM | POA: Diagnosis not present

## 2020-03-18 DIAGNOSIS — Z1159 Encounter for screening for other viral diseases: Secondary | ICD-10-CM | POA: Diagnosis not present

## 2020-03-18 DIAGNOSIS — E039 Hypothyroidism, unspecified: Secondary | ICD-10-CM | POA: Diagnosis not present

## 2020-03-18 DIAGNOSIS — E7800 Pure hypercholesterolemia, unspecified: Secondary | ICD-10-CM | POA: Diagnosis not present

## 2020-05-05 ENCOUNTER — Ambulatory Visit: Payer: Medicare Other | Admitting: Family Medicine

## 2020-06-04 ENCOUNTER — Telehealth: Payer: Self-pay | Admitting: Internal Medicine

## 2020-06-04 NOTE — Telephone Encounter (Signed)
Called and spoke with pt. Pt stated that the ignition interlock unit office never received form and PFT from February 2021. Stated to her that I would refax the form and PFT to provided fax number for [facility] and she verbalized understanding. This has been faxed for pt. Nothing further needed.

## 2020-07-16 ENCOUNTER — Other Ambulatory Visit: Payer: Self-pay | Admitting: Family Medicine

## 2020-07-16 DIAGNOSIS — E039 Hypothyroidism, unspecified: Secondary | ICD-10-CM

## 2020-07-29 ENCOUNTER — Encounter: Payer: Self-pay | Admitting: Internal Medicine

## 2020-07-29 ENCOUNTER — Other Ambulatory Visit: Payer: Self-pay

## 2020-07-29 ENCOUNTER — Ambulatory Visit (INDEPENDENT_AMBULATORY_CARE_PROVIDER_SITE_OTHER): Payer: Medicare Other | Admitting: Internal Medicine

## 2020-07-29 VITALS — BP 139/88 | HR 68 | Temp 97.6°F | Resp 18 | Ht 62.0 in | Wt 137.4 lb

## 2020-07-29 DIAGNOSIS — Z23 Encounter for immunization: Secondary | ICD-10-CM

## 2020-07-29 DIAGNOSIS — E039 Hypothyroidism, unspecified: Secondary | ICD-10-CM

## 2020-07-29 DIAGNOSIS — F419 Anxiety disorder, unspecified: Secondary | ICD-10-CM

## 2020-07-29 DIAGNOSIS — Z889 Allergy status to unspecified drugs, medicaments and biological substances status: Secondary | ICD-10-CM

## 2020-07-29 DIAGNOSIS — Z7689 Persons encountering health services in other specified circumstances: Secondary | ICD-10-CM | POA: Diagnosis not present

## 2020-07-29 DIAGNOSIS — Z1211 Encounter for screening for malignant neoplasm of colon: Secondary | ICD-10-CM

## 2020-07-29 DIAGNOSIS — M810 Age-related osteoporosis without current pathological fracture: Secondary | ICD-10-CM | POA: Insufficient documentation

## 2020-07-29 DIAGNOSIS — R030 Elevated blood-pressure reading, without diagnosis of hypertension: Secondary | ICD-10-CM

## 2020-07-29 DIAGNOSIS — I10 Essential (primary) hypertension: Secondary | ICD-10-CM | POA: Insufficient documentation

## 2020-07-29 DIAGNOSIS — J3802 Paralysis of vocal cords and larynx, bilateral: Secondary | ICD-10-CM

## 2020-07-29 DIAGNOSIS — Z124 Encounter for screening for malignant neoplasm of cervix: Secondary | ICD-10-CM

## 2020-07-29 DIAGNOSIS — Z0189 Encounter for other specified special examinations: Secondary | ICD-10-CM

## 2020-07-29 MED ORDER — RISEDRONATE SODIUM 35 MG PO TABS: 35.0000 mg | ORAL_TABLET | ORAL | 1 refills | Status: DC

## 2020-07-29 MED ORDER — LEVOTHYROXINE SODIUM 125 MCG PO TABS: 125.0000 ug | ORAL_TABLET | Freq: Every day | ORAL | 0 refills | Status: DC

## 2020-07-29 MED ORDER — FLUTICASONE PROPIONATE 50 MCG/ACT NA SUSP: 2.0000 | Freq: Every day | NASAL | 1 refills | Status: DC

## 2020-07-29 NOTE — Assessment & Plan Note (Addendum)
On Levothyroxine 125 mcg QD Had TSH and T4 done in 03/2020, was told they were normal. Will try to request record from [facility] Refill provided for now

## 2020-07-29 NOTE — Assessment & Plan Note (Signed)
Last DEXA scan reviewed On Calcium and Vit D supplement Takes natural Calcium tablets Counseled for need for bisphosphonate - patient had leg pain with some medication in the past, unsure of the name of the medication. Will start Risedronate for now

## 2020-07-29 NOTE — Assessment & Plan Note (Signed)
Well-controlled with Zoloft °
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
## 2020-07-29 NOTE — Assessment & Plan Note (Signed)
Care established Previous chart reviewed History and medications reviewed with the patient 

## 2020-07-29 NOTE — Patient Instructions (Addendum)
Please start taking Risedronate 35 mg once every week for osteoporosis.  You are being referred to physical therapy for strength training.  You are being referred to GI for screening colonoscopy. You are being referred to Ob.Gyn. for PAP smear.  Please continue to take Zoloft and Levothyroxine as prescribed.  Please maintain adequate hydration, at least 2 l of fluid/water intake.

## 2020-07-29 NOTE — Progress Notes (Signed)
amb re 

## 2020-07-29 NOTE — Assessment & Plan Note (Signed)
BP Readings from Last 1 Encounters:  07/29/20 139/88   Could be white coat hypertension Advised to check BP at home or pharmacy and will review in the next visit Advised DASH diet and moderate exercise/walking, at least 150 mins/week

## 2020-07-30 ENCOUNTER — Encounter (INDEPENDENT_AMBULATORY_CARE_PROVIDER_SITE_OTHER): Payer: Self-pay | Admitting: *Deleted

## 2020-08-02 DIAGNOSIS — Z889 Allergy status to unspecified drugs, medicaments and biological substances status: Secondary | ICD-10-CM | POA: Insufficient documentation
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
## 2020-08-06 ENCOUNTER — Ambulatory Visit (INDEPENDENT_AMBULATORY_CARE_PROVIDER_SITE_OTHER): Payer: Medicare Other | Admitting: Internal Medicine

## 2020-08-06 ENCOUNTER — Other Ambulatory Visit: Payer: Self-pay | Admitting: Internal Medicine

## 2020-08-06 ENCOUNTER — Other Ambulatory Visit: Payer: Self-pay

## 2020-08-06 ENCOUNTER — Encounter: Payer: Self-pay | Admitting: Acute Care

## 2020-08-06 ENCOUNTER — Ambulatory Visit: Payer: Medicare Other | Admitting: Acute Care

## 2020-08-06 VITALS — BP 128/88 | HR 78 | Temp 97.3°F | Ht 63.0 in | Wt 135.2 lb

## 2020-08-06 DIAGNOSIS — R0609 Other forms of dyspnea: Secondary | ICD-10-CM

## 2020-08-06 DIAGNOSIS — J383 Other diseases of vocal cords: Secondary | ICD-10-CM | POA: Diagnosis not present

## 2020-08-06 DIAGNOSIS — R0600 Dyspnea, unspecified: Secondary | ICD-10-CM | POA: Diagnosis not present

## 2020-08-06 LAB — PULMONARY FUNCTION TEST
DL/VA % pred: 116 %
DL/VA: 4.94 ml/min/mmHg/L
DLCO cor % pred: 108 %
DLCO cor: 21.3 ml/min/mmHg
DLCO unc % pred: 108 %
DLCO unc: 21.3 ml/min/mmHg
FEF 25-75 Post: 1.79 L/sec
FEF 25-75 Pre: 1.54 L/sec
FEF2575-%Change-Post: 16 %
FEF2575-%Pred-Post: 78 %
FEF2575-%Pred-Pre: 67 %
FEV1-%Change-Post: 2 %
FEV1-%Pred-Post: 80 %
FEV1-%Pred-Pre: 78 %
FEV1-Post: 1.98 L
FEV1-Pre: 1.94 L
FEV1FVC-%Change-Post: 5 %
FEV1FVC-%Pred-Pre: 94 %
FEV6-%Change-Post: -2 %
FEV6-%Pred-Post: 83 %
FEV6-%Pred-Pre: 85 %
FEV6-Post: 2.57 L
FEV6-Pre: 2.64 L
FEV6FVC-%Pred-Post: 103 %
FEV6FVC-%Pred-Pre: 103 %
FVC-%Change-Post: -2 %
FVC-%Pred-Post: 80 %
FVC-%Pred-Pre: 82 %
FVC-Post: 2.57 L
FVC-Pre: 2.64 L
Post FEV1/FVC ratio: 77 %
Post FEV6/FVC ratio: 100 %
Pre FEV1/FVC ratio: 73 %
Pre FEV6/FVC Ratio: 100 %
RV % pred: 146 %
RV: 2.84 L
TLC % pred: 111 %
TLC: 5.48 L

## 2020-08-06 NOTE — Progress Notes (Signed)
PFT done today. 
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
## 2020-08-11 NOTE — Progress Notes (Signed)
Spoke with pt and notified of results per Dr. Wert. Pt verbalized understanding and denied any questions. 

## 2020-08-12 ENCOUNTER — Other Ambulatory Visit: Payer: Self-pay

## 2020-08-12 ENCOUNTER — Other Ambulatory Visit (HOSPITAL_COMMUNITY)
Admission: RE | Admit: 2020-08-12 | Discharge: 2020-08-12 | Disposition: A | Discharge status: Home / Self Care | Payer: Medicare Other | Source: Ambulatory Visit | Attending: Obstetrics & Gynecology | Admitting: Obstetrics & Gynecology

## 2020-08-12 ENCOUNTER — Ambulatory Visit (INDEPENDENT_AMBULATORY_CARE_PROVIDER_SITE_OTHER): Payer: Medicare Other | Admitting: Obstetrics & Gynecology

## 2020-08-12 ENCOUNTER — Encounter: Payer: Self-pay | Admitting: Obstetrics & Gynecology

## 2020-08-12 VITALS — BP 128/80 | HR 74 | Ht 62.0 in | Wt 136.0 lb

## 2020-08-12 DIAGNOSIS — Z124 Encounter for screening for malignant neoplasm of cervix: Secondary | ICD-10-CM | POA: Diagnosis present

## 2020-08-12 DIAGNOSIS — Z1212 Encounter for screening for malignant neoplasm of rectum: Secondary | ICD-10-CM | POA: Diagnosis not present

## 2020-08-12 DIAGNOSIS — Z1151 Encounter for screening for human papillomavirus (HPV): Secondary | ICD-10-CM | POA: Diagnosis not present

## 2020-08-12 DIAGNOSIS — Z01419 Encounter for gynecological examination (general) (routine) without abnormal findings: Secondary | ICD-10-CM | POA: Diagnosis not present

## 2020-08-12 DIAGNOSIS — Z1211 Encounter for screening for malignant neoplasm of colon: Secondary | ICD-10-CM | POA: Diagnosis not present
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
## 2020-08-18 LAB — CYTOLOGY - PAP
Comment: NEGATIVE
Diagnosis: NEGATIVE
Diagnosis: REACTIVE
High risk HPV: NEGATIVE

## 2020-08-19 ENCOUNTER — Other Ambulatory Visit: Payer: Self-pay

## 2020-08-19 ENCOUNTER — Ambulatory Visit (HOSPITAL_COMMUNITY): Payer: Medicare Other | Attending: Internal Medicine | Admitting: Physical Therapy

## 2020-08-19 DIAGNOSIS — R296 Repeated falls: Secondary | ICD-10-CM | POA: Diagnosis present

## 2020-08-19 DIAGNOSIS — M25552 Pain in left hip: Secondary | ICD-10-CM | POA: Diagnosis not present

## 2020-08-19 DIAGNOSIS — M6281 Muscle weakness (generalized): Secondary | ICD-10-CM | POA: Diagnosis present
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
## 2020-08-22 ENCOUNTER — Ambulatory Visit (HOSPITAL_COMMUNITY): Payer: Medicare Other

## 2020-08-22 ENCOUNTER — Other Ambulatory Visit: Payer: Self-pay

## 2020-08-22 ENCOUNTER — Encounter (HOSPITAL_COMMUNITY): Payer: Self-pay

## 2020-08-22 DIAGNOSIS — R296 Repeated falls: Secondary | ICD-10-CM

## 2020-08-22 DIAGNOSIS — M6281 Muscle weakness (generalized): Secondary | ICD-10-CM

## 2020-08-22 DIAGNOSIS — M25552 Pain in left hip: Secondary | ICD-10-CM

## 2020-08-22 NOTE — Patient Instructions (Signed)
3/8 inch adjustable heel lift in heel of right shoe - start with 1/8 inch for a few days; then add another layer  HIP: Abduction / External Rotation - Side-Lying    Lie on side with hip and knees bent. Raise top knee up, squeezing glutes. Keep ankles together. _10-15__ reps per set, _1__ sets per day. Repeat on right.   Copyright  VHI. All rights reserved.   Prone Hip and Knee Extension    Try to lift left leg, keeping knee as straight as possible. Do not lift or turn hips nor arch back. Hold 1-2____ seconds. Repeat _10-15___ times. Do __1_ sessions per day.  [email or web address].exer.[email or web address]   Copyright  VHI. All rights reserved.   (Home) Knee: Flexion / Hamstring Curl - Prone    Push hip down into mat/floor to engage gluts. Pull foot toward buttocks. Repeat _101-5___ times per set. Do _1___ sets per session.   Copyright  VHI. All rights reserved.
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
## 2020-08-25 ENCOUNTER — Encounter (HOSPITAL_COMMUNITY): Payer: Medicare Other

## 2020-08-27 ENCOUNTER — Encounter (HOSPITAL_COMMUNITY): Payer: Medicare Other | Admitting: Physical Therapy

## 2020-08-27 ENCOUNTER — Telehealth (HOSPITAL_COMMUNITY): Payer: Self-pay | Admitting: Physical Therapy

## 2020-08-27 NOTE — Telephone Encounter (Signed)
pt cancelled appt for today because she fell last night and is really sore

## 2020-09-02 ENCOUNTER — Encounter (HOSPITAL_COMMUNITY): Payer: Self-pay | Admitting: Physical Therapy

## 2020-09-02 ENCOUNTER — Other Ambulatory Visit: Payer: Self-pay

## 2020-09-02 ENCOUNTER — Ambulatory Visit (HOSPITAL_COMMUNITY): Payer: Medicare Other | Admitting: Physical Therapy

## 2020-09-02 DIAGNOSIS — M6281 Muscle weakness (generalized): Secondary | ICD-10-CM

## 2020-09-02 DIAGNOSIS — M25552 Pain in left hip: Secondary | ICD-10-CM | POA: Diagnosis not present

## 2020-09-02 DIAGNOSIS — R296 Repeated falls: Secondary | ICD-10-CM
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
## 2020-09-02 NOTE — Patient Instructions (Signed)
Access Code: [email or web address] URL: [email or web address] Date: 09/02/2020 Prepared by: Mitzi Hansen Westly Hinnant  Exercises Side Stepping with Resistance at [employer] - 1 x daily - 7 x weekly - 6 sets - 10 reps

## 2020-09-04 ENCOUNTER — Other Ambulatory Visit: Payer: Self-pay | Admitting: Family Medicine

## 2020-09-04 ENCOUNTER — Telehealth (HOSPITAL_COMMUNITY): Payer: Self-pay | Admitting: Physical Therapy

## 2020-09-04 ENCOUNTER — Ambulatory Visit (HOSPITAL_COMMUNITY): Payer: Medicare Other | Attending: Internal Medicine | Admitting: Physical Therapy

## 2020-09-04 ENCOUNTER — Other Ambulatory Visit: Payer: Self-pay

## 2020-09-04 DIAGNOSIS — M25552 Pain in left hip: Secondary | ICD-10-CM | POA: Insufficient documentation

## 2020-09-04 DIAGNOSIS — M6281 Muscle weakness (generalized): Secondary | ICD-10-CM | POA: Diagnosis present

## 2020-09-04 DIAGNOSIS — R296 Repeated falls: Secondary | ICD-10-CM | POA: Diagnosis present

## 2020-09-04 DIAGNOSIS — F419 Anxiety disorder, unspecified: Secondary | ICD-10-CM

## 2020-09-04 NOTE — Telephone Encounter (Signed)
Pls cx 12/7 apptment she has to take her father to his MD appointment
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
## 2020-09-09 ENCOUNTER — Ambulatory Visit (HOSPITAL_COMMUNITY): Payer: Medicare Other | Admitting: Physical Therapy

## 2020-09-11 ENCOUNTER — Other Ambulatory Visit: Payer: Self-pay

## 2020-09-11 ENCOUNTER — Ambulatory Visit (HOSPITAL_COMMUNITY): Payer: Medicare Other | Admitting: Physical Therapy

## 2020-09-11 DIAGNOSIS — M6281 Muscle weakness (generalized): Secondary | ICD-10-CM

## 2020-09-11 DIAGNOSIS — M25552 Pain in left hip: Secondary | ICD-10-CM

## 2020-09-11 DIAGNOSIS — R296 Repeated falls: Secondary | ICD-10-CM
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
## 2020-09-16 ENCOUNTER — Encounter (HOSPITAL_COMMUNITY): Payer: Self-pay | Admitting: Physical Therapy

## 2020-09-16 ENCOUNTER — Ambulatory Visit (HOSPITAL_COMMUNITY): Payer: Medicare Other | Admitting: Physical Therapy

## 2020-09-16 ENCOUNTER — Telehealth (HOSPITAL_COMMUNITY): Payer: Self-pay | Admitting: Physical Therapy

## 2020-09-16 NOTE — Therapy (Signed)
[place], [place], [address] Phone: [phone]   Fax:  [phone]  Patient Details  Name: Stephanie Elliott MRN: [id number] Date of Birth: [birth year] Referring Provider:  Ihor Dow Encounter Date: 09/16/2020   PHYSICAL THERAPY DISCHARGE SUMMARY  Visits from Start of Care: 6  Current functional level related to goals / functional outcomes: unknown   Remaining deficits: Unknown Education / Equipment: HEP Plan: Patient agrees to discharge.  Patient goals were partially met. Patient is being discharged due to the patient's request.  ?????     [name] has failing health and she needs to be with him at this time.   Rayetta Humphrey, PT CLT [phone] 09/16/2020, 9:25 AM  [place], [place], [address] Phone: [phone]   Fax:  [phone]

## 2020-09-16 NOTE — Telephone Encounter (Signed)
pt cancelled the remainder of her appts because she has to take care of her father

## 2020-09-18 ENCOUNTER — Ambulatory Visit (HOSPITAL_COMMUNITY): Payer: Medicare Other | Admitting: Physical Therapy

## 2020-09-22 ENCOUNTER — Other Ambulatory Visit: Payer: Self-pay | Admitting: Internal Medicine

## 2020-09-22 DIAGNOSIS — E039 Hypothyroidism, unspecified: Secondary | ICD-10-CM

## 2020-12-01 ENCOUNTER — Other Ambulatory Visit: Payer: Self-pay

## 2020-12-01 ENCOUNTER — Encounter: Payer: Self-pay | Admitting: Internal Medicine

## 2020-12-01 ENCOUNTER — Ambulatory Visit (INDEPENDENT_AMBULATORY_CARE_PROVIDER_SITE_OTHER): Payer: Medicare Other | Admitting: Internal Medicine

## 2020-12-01 VITALS — BP 138/86 | HR 73 | Temp 97.7°F | Resp 18 | Ht 63.0 in | Wt 136.4 lb

## 2020-12-01 DIAGNOSIS — R030 Elevated blood-pressure reading, without diagnosis of hypertension: Secondary | ICD-10-CM | POA: Diagnosis not present

## 2020-12-01 DIAGNOSIS — E039 Hypothyroidism, unspecified: Secondary | ICD-10-CM

## 2020-12-01 DIAGNOSIS — Z889 Allergy status to unspecified drugs, medicaments and biological substances status: Secondary | ICD-10-CM | POA: Diagnosis not present

## 2020-12-01 DIAGNOSIS — F419 Anxiety disorder, unspecified: Secondary | ICD-10-CM | POA: Diagnosis not present

## 2020-12-01 DIAGNOSIS — M810 Age-related osteoporosis without current pathological fracture: Secondary | ICD-10-CM | POA: Diagnosis not present

## 2020-12-01 MED ORDER — FLUTICASONE PROPIONATE 50 MCG/ACT NA SUSP: 2.0000 | Freq: Every day | NASAL | 1 refills | Status: DC

## 2020-12-01 MED ORDER — DENOSUMAB 60 MG/ML ~~LOC~~ SOSY: 60.0000 mg | PREFILLED_SYRINGE | SUBCUTANEOUS | 0 refills | Status: DC

## 2020-12-01 MED ORDER — SERTRALINE HCL 50 MG PO TABS: 50.0000 mg | ORAL_TABLET | Freq: Every day | ORAL | 1 refills | Status: DC

## 2020-12-01 NOTE — Assessment & Plan Note (Signed)
On Levothyroxine 125 mcg QD Check TSH and free T4

## 2020-12-01 NOTE — Assessment & Plan Note (Signed)
BP Readings from Last 1 Encounters:  12/01/20 138/86   Could be white coat hypertension Advised to check BP at home or pharmacy and will review in the next visit Advised DASH diet and moderate exercise/walking, at least 150 mins/week

## 2020-12-01 NOTE — Assessment & Plan Note (Signed)
Well-controlled with Zoloft - refilled

## 2020-12-01 NOTE — Assessment & Plan Note (Signed)
Did not tolerate Risedronate, has tried another bisphosphonate in the past Started Prolia

## 2020-12-01 NOTE — Assessment & Plan Note (Signed)
Uses Flonase PRN

## 2020-12-01 NOTE — Patient Instructions (Signed)
Please continue to take medications as prescribed.  Please bring Prolia injection for administration.  Please continue to follow low salt diet and perform moderate exercise/walking at least 150 mins/week.  Please get fasting blood tests done within a week.
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
## 2020-12-03 DIAGNOSIS — E039 Hypothyroidism, unspecified: Secondary | ICD-10-CM | POA: Diagnosis not present

## 2020-12-03 DIAGNOSIS — M810 Age-related osteoporosis without current pathological fracture: Secondary | ICD-10-CM | POA: Diagnosis not present

## 2020-12-03 DIAGNOSIS — R030 Elevated blood-pressure reading, without diagnosis of hypertension: Secondary | ICD-10-CM | POA: Diagnosis not present

## 2020-12-04 ENCOUNTER — Other Ambulatory Visit: Payer: Self-pay | Admitting: Internal Medicine

## 2020-12-04 DIAGNOSIS — E039 Hypothyroidism, unspecified: Secondary | ICD-10-CM

## 2020-12-04 LAB — LIPID PANEL
Chol/HDL Ratio: 2.6 ratio (ref 0.0–4.4)
Cholesterol, Total: 203 mg/dL — ABNORMAL HIGH (ref 100–199)
HDL: 77 mg/dL (ref >39)
LDL Chol Calc (NIH): 113 mg/dL — ABNORMAL HIGH (ref 0–99)
Triglycerides: 73 mg/dL (ref 0–149)
VLDL Cholesterol Cal: 13 mg/dL (ref 5–40)

## 2020-12-04 LAB — CMP14+EGFR
ALT: 16 IU/L (ref 0–32)
AST: 20 IU/L (ref 0–40)
Albumin/Globulin Ratio: 2.1 (ref 1.2–2.2)
Albumin: 4.5 g/dL (ref 3.8–4.9)
Alkaline Phosphatase: 92 IU/L (ref 44–121)
BUN/Creatinine Ratio: 18 (ref 12–28)
BUN: 13 mg/dL (ref 8–27)
Bilirubin Total: 0.4 mg/dL (ref 0.0–1.2)
CO2: 24 mmol/L (ref 20–29)
Calcium: 9.7 mg/dL (ref 8.7–10.3)
Chloride: 101 mmol/L (ref 96–106)
Creatinine, Ser: 0.74 mg/dL (ref 0.57–1.00)
Globulin, Total: 2.1 g/dL (ref 1.5–4.5)
Glucose: 82 mg/dL (ref 65–99)
Potassium: 4.1 mmol/L (ref 3.5–5.2)
Sodium: 139 mmol/L (ref 134–144)
Total Protein: 6.6 g/dL (ref 6.0–8.5)
eGFR: 93 mL/min/{1.73_m2} (ref >59)

## 2020-12-04 LAB — TSH+FREE T4
Free T4: 1.67 ng/dL (ref 0.82–1.77)
TSH: 4.99 u[IU]/mL — ABNORMAL HIGH (ref 0.450–4.500)

## 2020-12-04 LAB — CBC
Hematocrit: 45.5 % (ref 34.0–46.6)
Hemoglobin: 15.5 g/dL (ref 11.1–15.9)
MCH: 30.6 pg (ref 26.6–33.0)
MCHC: 34.1 g/dL (ref 31.5–35.7)
MCV: 90 fL (ref 79–97)
Platelets: 191 10*3/uL (ref 150–450)
RBC: 5.07 x10E6/uL (ref 3.77–5.28)
RDW: 12.4 % (ref 11.7–15.4)
WBC: 5.4 10*3/uL (ref 3.4–10.8)

## 2020-12-04 LAB — VITAMIN D 25 HYDROXY (VIT D DEFICIENCY, FRACTURES): Vit D, 25-Hydroxy: 71.2 ng/mL (ref 30.0–100.0)

## 2020-12-04 MED ORDER — LEVOTHYROXINE SODIUM 137 MCG PO TABS: 137.0000 ug | ORAL_TABLET | Freq: Every day | ORAL | 0 refills | Status: DC

## 2020-12-14 ENCOUNTER — Other Ambulatory Visit: Payer: Self-pay | Admitting: Internal Medicine

## 2020-12-14 DIAGNOSIS — E039 Hypothyroidism, unspecified: Secondary | ICD-10-CM

## 2020-12-15 ENCOUNTER — Other Ambulatory Visit: Payer: Self-pay | Admitting: Internal Medicine

## 2020-12-15 DIAGNOSIS — E039 Hypothyroidism, unspecified: Secondary | ICD-10-CM

## 2020-12-16 ENCOUNTER — Other Ambulatory Visit: Payer: Self-pay | Admitting: Internal Medicine

## 2020-12-16 DIAGNOSIS — E039 Hypothyroidism, unspecified: Secondary | ICD-10-CM

## 2020-12-24 ENCOUNTER — Telehealth (INDEPENDENT_AMBULATORY_CARE_PROVIDER_SITE_OTHER): Payer: Self-pay

## 2020-12-24 ENCOUNTER — Other Ambulatory Visit (INDEPENDENT_AMBULATORY_CARE_PROVIDER_SITE_OTHER): Payer: Self-pay

## 2020-12-24 ENCOUNTER — Encounter (INDEPENDENT_AMBULATORY_CARE_PROVIDER_SITE_OTHER): Payer: Self-pay

## 2020-12-24 DIAGNOSIS — Z1211 Encounter for screening for malignant neoplasm of colon: Secondary | ICD-10-CM

## 2020-12-24 MED ORDER — NA SULFATE-K SULFATE-MG SULF 17.5-3.13-1.6 GM/177ML PO SOLN: 1.0000 | Freq: Once | ORAL | 0 refills | Status: AC

## 2020-12-24 NOTE — Telephone Encounter (Signed)
Ok to schedule.  Thanks,  Ellery Tash Castaneda Mayorga, MD Gastroenterology and Hepatology [facility]  

## 2020-12-24 NOTE — Telephone Encounter (Signed)
Referring MD/PCP:Patel   Procedure: TCS G0121  Reason/Indication:  screening  Has patient had this procedure before?  Yes  If so, when, by whom and where?  2011  Is there a family history of colon cancer?  No  Who?  What age when diagnosed?    Is patient diabetic?   No      Does patient have prosthetic heart valve or mechanical valve?  No  Do you have a pacemaker/defibrillator?  No  Has patient ever had endocarditis/atrial fibrillation? No  Have you had a stroke/heart attack last 6 mths? No  Does patient use oxygen? No  Has patient had joint replacement within last 12 months?  No  Is patient constipated or do they take laxatives? No  Does patient have a history of alcohol/drug use?  No  Is patient on blood thinner such as Coumadin, Plavix and/or Aspirin? No  Do you take medicine for weight loss. NO  Medications: Levothyroxine 125 mcg daily,Sertraline 50 mg daily,Vit b complex daily,Vit d 3 daily,plant calcium complex daily, fluticasone 50 mcg bid  Allergies: Cymbalta  Medication Adjustment per Dr Rehman/Dr Jenetta Downer No  Procedure date & time: 01/13/2021 am Covid testing 01/12/2021 at 8:15 am

## 2021-01-03 DIAGNOSIS — S63502A Unspecified sprain of left wrist, initial encounter: Secondary | ICD-10-CM | POA: Diagnosis not present

## 2021-01-03 DIAGNOSIS — M25532 Pain in left wrist: Secondary | ICD-10-CM | POA: Diagnosis not present

## 2021-01-03 DIAGNOSIS — W19XXXA Unspecified fall, initial encounter: Secondary | ICD-10-CM | POA: Diagnosis not present

## 2021-01-03 DIAGNOSIS — S5012XA Contusion of left forearm, initial encounter: Secondary | ICD-10-CM | POA: Diagnosis not present
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
## 2021-01-12 ENCOUNTER — Other Ambulatory Visit (HOSPITAL_COMMUNITY): Payer: Medicare Other

## 2021-01-27 ENCOUNTER — Other Ambulatory Visit: Payer: Self-pay | Admitting: Internal Medicine

## 2021-01-27 DIAGNOSIS — Z889 Allergy status to unspecified drugs, medicaments and biological substances status: Secondary | ICD-10-CM

## 2021-01-30 ENCOUNTER — Other Ambulatory Visit (HOSPITAL_COMMUNITY): Payer: Self-pay | Admitting: *Deleted

## 2021-02-02 ENCOUNTER — Other Ambulatory Visit: Payer: Self-pay

## 2021-02-02 ENCOUNTER — Other Ambulatory Visit: Payer: Self-pay | Admitting: Internal Medicine

## 2021-02-02 ENCOUNTER — Other Ambulatory Visit (HOSPITAL_COMMUNITY): Payer: Self-pay | Admitting: Obstetrics & Gynecology

## 2021-02-02 ENCOUNTER — Other Ambulatory Visit (HOSPITAL_COMMUNITY)
Admission: RE | Admit: 2021-02-02 | Discharge: 2021-02-02 | Disposition: A | Discharge status: Home / Self Care | Payer: Medicare Other | Source: Ambulatory Visit | Attending: Gastroenterology | Admitting: Gastroenterology

## 2021-02-02 DIAGNOSIS — Z01812 Encounter for preprocedural laboratory examination: Secondary | ICD-10-CM | POA: Insufficient documentation

## 2021-02-02 DIAGNOSIS — Z1231 Encounter for screening mammogram for malignant neoplasm of breast: Secondary | ICD-10-CM

## 2021-02-02 DIAGNOSIS — E039 Hypothyroidism, unspecified: Secondary | ICD-10-CM

## 2021-02-02 DIAGNOSIS — Z20822 Contact with and (suspected) exposure to covid-19: Secondary | ICD-10-CM | POA: Insufficient documentation

## 2021-02-03 ENCOUNTER — Encounter (HOSPITAL_COMMUNITY)
Admission: RE | Disposition: A | Discharge status: Home / Self Care | Payer: Self-pay | Source: Home / Self Care | Attending: Gastroenterology

## 2021-02-03 ENCOUNTER — Ambulatory Visit (HOSPITAL_COMMUNITY)
Admission: RE | Admit: 2021-02-03 | Discharge: 2021-02-03 | Disposition: A | Discharge status: Home / Self Care | Payer: Medicare Other | Attending: Gastroenterology | Admitting: Gastroenterology

## 2021-02-03 ENCOUNTER — Ambulatory Visit (HOSPITAL_COMMUNITY): Payer: Medicare Other | Admitting: Anesthesiology

## 2021-02-03 ENCOUNTER — Encounter (HOSPITAL_COMMUNITY): Payer: Self-pay | Admitting: Gastroenterology

## 2021-02-03 ENCOUNTER — Other Ambulatory Visit: Payer: Self-pay

## 2021-02-03 DIAGNOSIS — Z87892 Personal history of anaphylaxis: Secondary | ICD-10-CM | POA: Diagnosis not present

## 2021-02-03 DIAGNOSIS — Z79899 Other long term (current) drug therapy: Secondary | ICD-10-CM | POA: Diagnosis not present

## 2021-02-03 DIAGNOSIS — Z888 Allergy status to other drugs, medicaments and biological substances status: Secondary | ICD-10-CM | POA: Insufficient documentation

## 2021-02-03 DIAGNOSIS — Z79890 Hormone replacement therapy (postmenopausal): Secondary | ICD-10-CM | POA: Insufficient documentation

## 2021-02-03 DIAGNOSIS — Z87891 Personal history of nicotine dependence: Secondary | ICD-10-CM | POA: Insufficient documentation

## 2021-02-03 DIAGNOSIS — K635 Polyp of colon: Secondary | ICD-10-CM | POA: Diagnosis not present

## 2021-02-03 DIAGNOSIS — Z885 Allergy status to narcotic agent status: Secondary | ICD-10-CM | POA: Diagnosis not present

## 2021-02-03 DIAGNOSIS — D120 Benign neoplasm of cecum: Secondary | ICD-10-CM | POA: Diagnosis not present

## 2021-02-03 DIAGNOSIS — D122 Benign neoplasm of ascending colon: Secondary | ICD-10-CM | POA: Insufficient documentation

## 2021-02-03 DIAGNOSIS — E039 Hypothyroidism, unspecified: Secondary | ICD-10-CM | POA: Diagnosis not present

## 2021-02-03 DIAGNOSIS — D125 Benign neoplasm of sigmoid colon: Secondary | ICD-10-CM | POA: Diagnosis not present

## 2021-02-03 DIAGNOSIS — Z1211 Encounter for screening for malignant neoplasm of colon: Secondary | ICD-10-CM | POA: Diagnosis not present

## 2021-02-03 HISTORY — PX: POLYPECTOMY: SHX5525

## 2021-02-03 HISTORY — PX: COLONOSCOPY WITH PROPOFOL: SHX5780

## 2021-02-03 LAB — SARS CORONAVIRUS 2 (TAT 6-24 HRS): SARS Coronavirus 2: NEGATIVE

## 2021-02-03 SURGERY — COLONOSCOPY WITH PROPOFOL
Anesthesia: General

## 2021-02-03 MED ORDER — STERILE WATER FOR IRRIGATION IR SOLN
Status: DC | PRN
  Administered 2021-02-03: 1.5 mL

## 2021-02-03 MED ORDER — LACTATED RINGERS IV SOLN
INTRAVENOUS | Status: DC
  Administered 2021-02-03: 1000 mL via INTRAVENOUS

## 2021-02-03 MED ORDER — PROPOFOL 500 MG/50ML IV EMUL
INTRAVENOUS | Status: DC | PRN
  Administered 2021-02-03: 150 ug/kg/min via INTRAVENOUS

## 2021-02-03 MED ORDER — CHLORHEXIDINE GLUCONATE CLOTH 2 % EX PADS: 6.0000 | MEDICATED_PAD | Freq: Once | CUTANEOUS | Status: DC

## 2021-02-03 MED ORDER — PROPOFOL 10 MG/ML IV BOLUS
INTRAVENOUS | Status: DC | PRN
  Administered 2021-02-03 (×3): 50 mg via INTRAVENOUS
  Administered 2021-02-03: 100 mg via INTRAVENOUS

## 2021-02-03 MED ORDER — LIDOCAINE HCL (CARDIAC) PF 100 MG/5ML IV SOSY
PREFILLED_SYRINGE | INTRAVENOUS | Status: DC | PRN
  Administered 2021-02-03: 50 mg via INTRAVENOUS

## 2021-02-03 MED ORDER — LEVOTHYROXINE SODIUM 137 MCG PO TABS: 137.0000 ug | ORAL_TABLET | Freq: Every day | ORAL | 0 refills | Status: DC

## 2021-02-03 NOTE — Anesthesia Procedure Notes (Signed)
Date/Time: 02/03/2021 8:54 AM Performed by: Orlie Dakin, CRNA Pre-anesthesia Checklist: Patient identified, Emergency Drugs available, Suction available and Patient being monitored Patient Re-evaluated:Patient Re-evaluated prior to induction Oxygen Delivery Method: Nasal cannula Induction Type: IV induction Placement Confirmation: positive ETCO2
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
## 2021-02-03 NOTE — Op Note (Addendum)
[facility] Patient Name: Stephanie Elliott Procedure Date: 02/03/2021 7:48 AM MRN: [id number] Date of Birth: [birth year] Attending MD: Maylon Peppers ,  CSN: [id number] Age: 60 Admit Type: Outpatient Procedure:                Colonoscopy Indications:              Screening for colorectal malignant neoplasm Providers:                Maylon Peppers Referring MD:              Medicines:                Monitored Anesthesia Care Complications:             Estimated Blood Loss:     Estimated blood loss: none. Estimated blood loss:                            none. Procedure:                Pre-Anesthesia Assessment:                           - Prior to the procedure, a History and Physical                            was performed, and patient medications, allergies                            and sensitivities were reviewed. The patient's                            tolerance of previous anesthesia was reviewed.                           - The risks and benefits of the procedure and the                            sedation options and risks were discussed with the                            patient. All questions were answered and informed                            consent was obtained.                           - ASA Grade Assessment: I - A normal, healthy                            patient.                           After obtaining informed consent, the colonoscope                            was passed under direct vision. Throughout the  procedure, the patient's blood pressure, pulse, and                            oxygen saturations were monitored continuously. The                            [id number]-[id number] ([id number]) scope was introduced through                            the anus and advanced to the the cecum, identified                            by appendiceal orifice and ileocecal valve. The                            colonoscopy was performed without  difficulty. The                            patient tolerated the procedure well. The quality                            of the bowel preparation was adequate. Scope In: 8:53:22 AM Scope Out: 9:29:21 AM Scope Withdrawal Time: 0 hours 27 minutes 12 seconds  Total Procedure Duration: 0 hours 35 minutes 59 seconds  Findings:      The perianal and digital rectal examinations were normal.      Three sessile polyps were found in the sigmoid colon, ascending colon       and cecum. The polyps were 2 to 4 mm in size. These polyps were removed       with a cold snare. Resection and retrieval were complete.      The retroflexed view of the distal rectum and anal verge was normal and       showed no anal or rectal abnormalities. Impression:               - No specimens collected. Moderate Sedation:      Per Anesthesia Care Recommendation:           - Discharge patient to home (ambulatory).                           - Resume previous diet.                           - Await pathology results. Procedure Code(s):        --- Professional ---                           [phone], Colonoscopy, flexible; with removal of                            tumor(s), polyp(s), or other lesion(s) by snare                            technique Diagnosis Code(s):        --- Professional ---  Z12.11, Encounter for screening for malignant                            neoplasm of colon CPT copyright 2019 American Medical Association. All rights reserved. The codes documented in this report are preliminary and upon coder review may  be revised to meet current compliance requirements. Maylon Peppers, MD Maylon Peppers,  02/03/2021 9:34:31 AM This report has been signed electronically. Number of Addenda: 0

## 2021-02-03 NOTE — Anesthesia Postprocedure Evaluation (Signed)
Anesthesia Post Note  Patient: Stephanie Elliott  Procedure(s) Performed: COLONOSCOPY WITH PROPOFOL (N/A ) POLYPECTOMY  Patient location during evaluation: Endoscopy Anesthesia Type: General Level of consciousness: awake and alert and oriented Pain management: pain level controlled Vital Signs Assessment: post-procedure vital signs reviewed and stable Respiratory status: spontaneous breathing and respiratory function stable Cardiovascular status: blood pressure returned to baseline and stable Postop Assessment: no apparent nausea or vomiting Anesthetic complications: no   No complications documented.   Last Vitals:  Vitals:   02/03/21 0736 02/03/21 0933  BP: 135/78 121/71  Pulse: 71 71  Resp: 12 19  Temp: 36.5 C 36.5 C  SpO2: 99% 100%    Last Pain:  Vitals:   02/03/21 0933  TempSrc: Oral  PainSc: 0-No pain                 Ilissa Rosner C Abdinasir Spadafore

## 2021-02-03 NOTE — Discharge Instructions (Signed)
You are being discharged to home.  Resume your previous diet.  We are waiting for your pathology results.  Colonoscopy, Adult, Care After This sheet gives you information about how to care for yourself after your procedure. Your doctor may also give you more specific instructions. If you have problems or questions, call your doctor. What can I expect after the procedure? After the procedure, it is common to have:  A small amount of blood in your poop (stool) for 24 hours.  Some gas.  Mild cramping or bloating in your belly (abdomen). Follow these instructions at home: Eating and drinking  Drink enough fluid to keep your pee (urine) pale yellow.  Follow instructions from your doctor about what you cannot eat or drink.  Return to your normal diet as told by your doctor. Avoid heavy or fried foods that are hard to digest.   Activity  Rest as told by your doctor.  Do not sit for a long time without moving. Get up to take short walks every 1-2 hours. This is important. Ask for help if you feel weak or unsteady.  Return to your normal activities as told by your doctor. Ask your doctor what activities are safe for you. To help cramping and bloating:  Try walking around.  Put heat on your belly as told by your doctor. Use the heat source that your doctor recommends, such as a moist heat pack or a heating pad. ? Put a towel between your skin and the heat source. ? Leave the heat on for 20-30 minutes. ? Remove the heat if your skin turns bright red. This is very important if you are unable to feel pain, heat, or cold. You may have a greater risk of getting burned.   General instructions  If you were given a medicine to help you relax (sedative) during your procedure, it can affect you for many hours. Do not drive or use machinery until your doctor says that it is safe.  For the first 24 hours after the procedure: ? Do not sign important documents. ? Do not drink alcohol. ? Do your  daily activities more slowly than normal. ? Eat foods that are soft and easy to digest.  Take over-the-counter or prescription medicines only as told by your doctor.  Keep all follow-up visits as told by your doctor. This is important. Contact a doctor if:  You have blood in your poop 2-3 days after the procedure. Get help right away if:  You have more than a small amount of blood in your poop.  You see large clumps of tissue (blood clots) in your poop.  Your belly is swollen.  You feel like you may vomit (nauseous).  You vomit.  You have a fever.  You have belly pain that gets worse, and medicine does not help your pain. Summary  After the procedure, it is common to have a small amount of blood in your poop. You may also have mild cramping and bloating in your belly.  If you were given a medicine to help you relax (sedative) during your procedure, it can affect you for many hours. Do not drive or use machinery until your doctor says that it is safe.  Get help right away if you have a lot of blood in your poop, feel like you may vomit, have a fever, or have more belly pain. This information is not intended to replace advice given to you by your health care provider. Make sure you discuss  any questions you have with your health care provider. Document Revised: 07/27/2019 Document Reviewed: 04/16/2019 Elsevier Patient Education  [place].

## 2021-02-03 NOTE — Transfer of Care (Signed)
Immediate Anesthesia Transfer of Care Note  Patient: Stephanie Elliott  Procedure(s) Performed: COLONOSCOPY WITH PROPOFOL (N/A ) POLYPECTOMY  Patient Location: Endoscopy Unit  Anesthesia Type:General  Level of Consciousness: awake, alert  and oriented  Airway & Oxygen Therapy: Patient Spontanous Breathing  Post-op Assessment: Report given to RN and Post -op Vital signs reviewed and stable  Post vital signs: Reviewed and stable  Last Vitals:  Vitals Value Taken Time  BP 121/71 02/03/21 0933  Temp 36.5 C 02/03/21 0933  Pulse 71 02/03/21 0933  Resp 19 02/03/21 0933  SpO2 100 % 02/03/21 0933    Last Pain:  Vitals:   02/03/21 0933  TempSrc: Oral  PainSc: 0-No pain      Patients Stated Pain Goal: 5 (96/22/29 7989)  Complications: No complications documented.

## 2021-02-03 NOTE — Anesthesia Preprocedure Evaluation (Addendum)
Anesthesia Evaluation  Patient identified by MRN, date of birth, ID band Patient awake    Reviewed: Allergy & Precautions, NPO status , Patient's Chart, lab work & pertinent test results  History of Anesthesia Complications (+) DIFFICULT AIRWAY and history of anesthetic complications (if ETT required, needs #4 Ped's ETT per ENT Dr. [facility]))  Airway Mallampati: II  TM Distance: >3 FB Neck ROM: Full   Comment: Bilateral vocal cord paralysis Dental  (+) Dental Advisory Given, Missing   Pulmonary shortness of breath and with exertion, pneumonia, former smoker,  Bilateral vocal cord paralysis   Pulmonary exam normal breath sounds clear to auscultation       Cardiovascular Exercise Tolerance: Good METS: 3 - Mets + DOE  Normal cardiovascular exam Rhythm:Regular Rate:Normal     Neuro/Psych PSYCHIATRIC DISORDERS Anxiety Depression  Neuromuscular disease    GI/Hepatic negative GI ROS, Neg liver ROS,   Endo/Other  Hypothyroidism   Renal/GU negative Renal ROS     Musculoskeletal  (+) Arthritis  (spinal fusion), CRPS   Abdominal   Peds  Hematology  (+) anemia ,   Anesthesia Other Findings Bilateral vocal cord paralysis, laryngeal sx Tracheostomy, if ETT required, needs #4 Ped's ETT per ENT Dr. [facility]) Used 6.5 ETT for back sx, attempted 3 times to secure airway, cooks catheter was used.   Reproductive/Obstetrics                            Anesthesia Physical Anesthesia Plan  ASA: III  Anesthesia Plan: General   Post-op Pain Management:    Induction: Intravenous  PONV Risk Score and Plan: Propofol infusion  Airway Management Planned: Nasal Cannula and Natural Airway  Additional Equipment:   Intra-op Plan:   Post-operative Plan:   Informed Consent: I have reviewed the patients History and Physical, chart, labs and discussed the procedure including the  risks, benefits and alternatives for the proposed anesthesia with the patient or authorized representative who has indicated his/her understanding and acceptance.     Dental advisory given  Plan Discussed with: CRNA and Surgeon  Anesthesia Plan Comments: (Risk of aspiration was explained.)       Anesthesia Quick Evaluation

## 2021-02-04 ENCOUNTER — Ambulatory Visit (HOSPITAL_COMMUNITY)
Admission: RE | Admit: 2021-02-04 | Discharge: 2021-02-04 | Disposition: A | Discharge status: Home / Self Care | Payer: Medicare Other | Source: Ambulatory Visit | Attending: Obstetrics & Gynecology | Admitting: Obstetrics & Gynecology

## 2021-02-04 DIAGNOSIS — Z1231 Encounter for screening mammogram for malignant neoplasm of breast: Secondary | ICD-10-CM | POA: Diagnosis not present

## 2021-02-04 LAB — SURGICAL PATHOLOGY

## 2021-02-09 ENCOUNTER — Ambulatory Visit (INDEPENDENT_AMBULATORY_CARE_PROVIDER_SITE_OTHER): Payer: Medicare Other

## 2021-02-09 ENCOUNTER — Other Ambulatory Visit: Payer: Self-pay

## 2021-02-09 VITALS — BP 148/90 | HR 68 | Temp 98.3°F | Ht 63.0 in | Wt 136.0 lb

## 2021-02-09 DIAGNOSIS — Z0000 Encounter for general adult medical examination without abnormal findings: Secondary | ICD-10-CM | POA: Diagnosis not present

## 2021-02-09 NOTE — Patient Instructions (Signed)
Stephanie Elliott , Thank you for taking time to come for your Medicare Wellness Visit. I appreciate your ongoing commitment to your health goals. Please review the following plan we discussed and let me know if I can assist you in the future.   Screening recommendations/referrals: Colonoscopy: Up to date, next due 02/03/2026 Mammogram: Up to date, next due  02/04/2022 Bone Density: Not due until age 61 Recommended yearly ophthalmology/optometry visit for glaucoma screening and checkup Recommended yearly dental visit for hygiene and checkup  Vaccinations: Influenza vaccine: Up to date, next due fall 2022  Pneumococcal vaccine: Up to date, next due 64  Tdap vaccine: Currently due, you may await and injury to receive or get at the pharmacy Shingles vaccine: Completed series    Advanced directives: Advance directive discussed with you today. Even though you declined this today please call our office should you change your mind and we can give you the proper paperwork for you to fill out.   Conditions/risks identified: None  Next appointment: 02/15/2022 @ 3:00 PM with Nurse Health Advisor   Preventive Care 40-64 Years, Female Preventive care refers to lifestyle choices and visits with your health care provider that can promote health and wellness. What does preventive care include?  A yearly physical exam. This is also called an annual well check.  Dental exams once or twice a year.  Routine eye exams. Ask your health care provider how often you should have your eyes checked.  Personal lifestyle choices, including:  Daily care of your teeth and gums.  Regular physical activity.  Eating a healthy diet.  Avoiding tobacco and drug use.  Limiting alcohol use.  Practicing safe sex.  Taking low-dose aspirin daily starting at age 23.  Taking vitamin and mineral supplements as recommended by your health care provider. What happens during an annual well check? The services and  screenings done by your health care provider during your annual well check will depend on your age, overall health, lifestyle risk factors, and family history of disease. Counseling  Your health care provider may ask you questions about your:  Alcohol use.  Tobacco use.  Drug use.  Emotional well-being.  Home and relationship well-being.  Sexual activity.  Eating habits.  Work and work Astronomer.  Method of birth control.  Menstrual cycle.  Pregnancy history. Screening  You may have the following tests or measurements:  Height, weight, and BMI.  Blood pressure.  Lipid and cholesterol levels. These may be checked every 5 years, or more frequently if you are over 53 years old.  Skin check.  Lung cancer screening. You may have this screening every year starting at age 22 if you have a 30-pack-year history of smoking and currently smoke or have quit within the past 15 years.  Fecal occult blood test (FOBT) of the stool. You may have this test every year starting at age 2.  Flexible sigmoidoscopy or colonoscopy. You may have a sigmoidoscopy every 5 years or a colonoscopy every 10 years starting at age 45.  Hepatitis C blood test.  Hepatitis B blood test.  Sexually transmitted disease (STD) testing.  Diabetes screening. This is done by checking your blood sugar (glucose) after you have not eaten for a while (fasting). You may have this done every 1-3 years.  Mammogram. This may be done every 1-2 years. Talk to your health care provider about when you should start having regular mammograms. This may depend on whether you have a family history of breast cancer.  BRCA-related cancer screening. This may be done if you have a family history of breast, ovarian, tubal, or peritoneal cancers.  Pelvic exam and Pap test. This may be done every 3 years starting at age 57. Starting at age 67, this may be done every 5 years if you have a Pap test in combination with an HPV  test.  Bone density scan. This is done to screen for osteoporosis. You may have this scan if you are at high risk for osteoporosis. Discuss your test results, treatment options, and if necessary, the need for more tests with your health care provider. Vaccines  Your health care provider may recommend certain vaccines, such as:  Influenza vaccine. This is recommended every year.  Tetanus, diphtheria, and acellular pertussis (Tdap, Td) vaccine. You may need a Td booster every 10 years.  Zoster vaccine. You may need this after age 96.  Pneumococcal 13-valent conjugate (PCV13) vaccine. You may need this if you have certain conditions and were not previously vaccinated.  Pneumococcal polysaccharide (PPSV23) vaccine. You may need one or two doses if you smoke cigarettes or if you have certain conditions. Talk to your health care provider about which screenings and vaccines you need and how often you need them. This information is not intended to replace advice given to you by your health care provider. Make sure you discuss any questions you have with your health care provider. Document Released: 10/17/2015 Document Revised: 06/09/2016 Document Reviewed: 07/22/2015 Elsevier Interactive Patient Education  2017 [place] Prevention in the Home Falls can cause injuries. They can happen to people of all ages. There are many things you can do to make your home safe and to help prevent falls. What can I do on the outside of my home?  Regularly fix the edges of walkways and driveways and fix any cracks.  Remove anything that might make you trip as you walk through a door, such as a raised step or threshold.  Trim any bushes or trees on the path to your home.  Use bright outdoor lighting.  Clear any walking paths of anything that might make someone trip, such as rocks or tools.  Regularly check to see if handrails are loose or broken. Make sure that both sides of any steps have  handrails.  Any raised decks and porches should have guardrails on the edges.  Have any leaves, snow, or ice cleared regularly.  Use sand or salt on walking paths during winter.  Clean up any spills in your garage right away. This includes oil or grease spills. What can I do in the bathroom?  Use night lights.  Install grab bars by the toilet and in the tub and shower. Do not use towel bars as grab bars.  Use non-skid mats or decals in the tub or shower.  If you need to sit down in the shower, use a plastic, non-slip stool.  Keep the floor dry. Clean up any water that spills on the floor as soon as it happens.  Remove soap buildup in the tub or shower regularly.  Attach bath mats securely with double-sided non-slip rug tape.  Do not have throw rugs and other things on the floor that can make you trip. What can I do in the bedroom?  Use night lights.  Make sure that you have a light by your bed that is easy to reach.  Do not use any sheets or blankets that are too big for your bed. They  should not hang down onto the floor.  Have a firm chair that has side arms. You can use this for support while you get dressed.  Do not have throw rugs and other things on the floor that can make you trip. What can I do in the kitchen?  Clean up any spills right away.  Avoid walking on wet floors.  Keep items that you use a lot in easy-to-reach places.  If you need to reach something above you, use a strong step stool that has a grab bar.  Keep electrical cords out of the way.  Do not use floor polish or wax that makes floors slippery. If you must use wax, use non-skid floor wax.  Do not have throw rugs and other things on the floor that can make you trip. What can I do with my stairs?  Do not leave any items on the stairs.  Make sure that there are handrails on both sides of the stairs and use them. Fix handrails that are broken or loose. Make sure that handrails are as long as  the stairways.  Check any carpeting to make sure that it is firmly attached to the stairs. Fix any carpet that is loose or worn.  Avoid having throw rugs at the top or bottom of the stairs. If you do have throw rugs, attach them to the floor with carpet tape.  Make sure that you have a light switch at the top of the stairs and the bottom of the stairs. If you do not have them, ask someone to add them for you. What else can I do to help prevent falls?  Wear shoes that:  Do not have high heels.  Have rubber bottoms.  Are comfortable and fit you well.  Are closed at the toe. Do not wear sandals.  If you use a stepladder:  Make sure that it is fully opened. Do not climb a closed stepladder.  Make sure that both sides of the stepladder are locked into place.  Ask someone to hold it for you, if possible.  Clearly mark and make sure that you can see:  Any grab bars or handrails.  First and last steps.  Where the edge of each step is.  Use tools that help you move around (mobility aids) if they are needed. These include:  Canes.  Walkers.  Scooters.  Crutches.  Turn on the lights when you go into a dark area. Replace any light bulbs as soon as they burn out.  Set up your furniture so you have a clear path. Avoid moving your furniture around.  If any of your floors are uneven, fix them.  If there are any pets around you, be aware of where they are.  Review your medicines with your doctor. Some medicines can make you feel dizzy. This can increase your chance of falling. Ask your doctor what other things that you can do to help prevent falls. This information is not intended to replace advice given to you by your health care provider. Make sure you discuss any questions you have with your health care provider. Document Released: 07/17/2009 Document Revised: 02/26/2016 Document Reviewed: 10/25/2014 Elsevier Interactive Patient Education  2017 [employer].
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
## 2021-02-10 ENCOUNTER — Encounter (HOSPITAL_COMMUNITY): Payer: Self-pay | Admitting: Gastroenterology

## 2021-03-03 ENCOUNTER — Other Ambulatory Visit: Payer: Self-pay

## 2021-03-03 ENCOUNTER — Encounter: Payer: Self-pay | Admitting: Internal Medicine

## 2021-03-03 ENCOUNTER — Ambulatory Visit (INDEPENDENT_AMBULATORY_CARE_PROVIDER_SITE_OTHER): Payer: Medicare Other | Admitting: Internal Medicine

## 2021-03-03 VITALS — BP 136/84 | HR 87 | Temp 98.8°F | Resp 18 | Ht 63.0 in | Wt 138.1 lb

## 2021-03-03 DIAGNOSIS — R030 Elevated blood-pressure reading, without diagnosis of hypertension: Secondary | ICD-10-CM | POA: Diagnosis not present

## 2021-03-03 DIAGNOSIS — E039 Hypothyroidism, unspecified: Secondary | ICD-10-CM

## 2021-03-03 DIAGNOSIS — J3802 Paralysis of vocal cords and larynx, bilateral: Secondary | ICD-10-CM | POA: Diagnosis not present

## 2021-03-03 DIAGNOSIS — Z1159 Encounter for screening for other viral diseases: Secondary | ICD-10-CM

## 2021-03-03 DIAGNOSIS — F419 Anxiety disorder, unspecified: Secondary | ICD-10-CM | POA: Diagnosis not present

## 2021-03-03 DIAGNOSIS — Z114 Encounter for screening for human immunodeficiency virus [HIV]: Secondary | ICD-10-CM

## 2021-03-03 NOTE — Assessment & Plan Note (Signed)
On Levothyroxine 137 mcg QD Check TSH and free T4
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
## 2021-03-03 NOTE — Assessment & Plan Note (Signed)
BP Readings from Last 1 Encounters:  03/03/21 136/84   Advised DASH diet and moderate exercise/walking, at least 150 mins/week
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
## 2021-03-03 NOTE — Assessment & Plan Note (Signed)
Well-controlled with Zoloft °

## 2021-03-03 NOTE — Patient Instructions (Addendum)
Please continue to take medications as prescribed.  Please continue to follow DASH diet and perform moderate exercise/walking at least 150 mins/week.  Please get fasting blood tests before the next visit.

## 2021-04-08 ENCOUNTER — Other Ambulatory Visit: Payer: Self-pay | Admitting: Internal Medicine

## 2021-04-08 DIAGNOSIS — E039 Hypothyroidism, unspecified: Secondary | ICD-10-CM

## 2021-04-16 DIAGNOSIS — H524 Presbyopia: Secondary | ICD-10-CM | POA: Diagnosis not present

## 2021-04-16 DIAGNOSIS — H35413 Lattice degeneration of retina, bilateral: Secondary | ICD-10-CM | POA: Diagnosis not present

## 2021-04-20 ENCOUNTER — Other Ambulatory Visit: Payer: Self-pay | Admitting: Internal Medicine

## 2021-04-20 DIAGNOSIS — F419 Anxiety disorder, unspecified: Secondary | ICD-10-CM

## 2021-06-18 DIAGNOSIS — R030 Elevated blood-pressure reading, without diagnosis of hypertension: Secondary | ICD-10-CM | POA: Diagnosis not present

## 2021-06-18 DIAGNOSIS — E039 Hypothyroidism, unspecified: Secondary | ICD-10-CM | POA: Diagnosis not present

## 2021-06-18 DIAGNOSIS — Z1159 Encounter for screening for other viral diseases: Secondary | ICD-10-CM | POA: Diagnosis not present

## 2021-06-19 LAB — LIPID PANEL
Chol/HDL Ratio: 2.8 ratio (ref 0.0–4.4)
Cholesterol, Total: 200 mg/dL — ABNORMAL HIGH (ref 100–199)
HDL: 72 mg/dL (ref >39)
LDL Chol Calc (NIH): 116 mg/dL — ABNORMAL HIGH (ref 0–99)
Triglycerides: 66 mg/dL (ref 0–149)
VLDL Cholesterol Cal: 12 mg/dL (ref 5–40)

## 2021-06-19 LAB — CBC WITH DIFFERENTIAL/PLATELET
Basophils Absolute: 0.1 10*3/uL (ref 0.0–0.2)
Basos: 1 %
EOS (ABSOLUTE): 0.3 10*3/uL (ref 0.0–0.4)
Eos: 5 %
Hematocrit: 44 % (ref 34.0–46.6)
Hemoglobin: 15 g/dL (ref 11.1–15.9)
Immature Grans (Abs): 0 10*3/uL (ref 0.0–0.1)
Immature Granulocytes: 0 %
Lymphocytes Absolute: 1.2 10*3/uL (ref 0.7–3.1)
Lymphs: 24 %
MCH: 30.6 pg (ref 26.6–33.0)
MCHC: 34.1 g/dL (ref 31.5–35.7)
MCV: 90 fL (ref 79–97)
Monocytes Absolute: 0.4 10*3/uL (ref 0.1–0.9)
Monocytes: 8 %
Neutrophils Absolute: 3 10*3/uL (ref 1.4–7.0)
Neutrophils: 62 %
Platelets: 184 10*3/uL (ref 150–450)
RBC: 4.9 x10E6/uL (ref 3.77–5.28)
RDW: 12.4 % (ref 11.7–15.4)
WBC: 4.8 10*3/uL (ref 3.4–10.8)

## 2021-06-19 LAB — CMP14+EGFR
ALT: 16 IU/L (ref 0–32)
AST: 25 IU/L (ref 0–40)
Albumin/Globulin Ratio: 2 (ref 1.2–2.2)
Albumin: 4.3 g/dL (ref 3.8–4.8)
Alkaline Phosphatase: 97 IU/L (ref 44–121)
BUN/Creatinine Ratio: 26 (ref 12–28)
BUN: 19 mg/dL (ref 8–27)
Bilirubin Total: 0.5 mg/dL (ref 0.0–1.2)
CO2: 25 mmol/L (ref 20–29)
Calcium: 9.5 mg/dL (ref 8.7–10.3)
Chloride: 100 mmol/L (ref 96–106)
Creatinine, Ser: 0.72 mg/dL (ref 0.57–1.00)
Globulin, Total: 2.2 g/dL (ref 1.5–4.5)
Glucose: 94 mg/dL (ref 65–99)
Potassium: 4 mmol/L (ref 3.5–5.2)
Sodium: 138 mmol/L (ref 134–144)
Total Protein: 6.5 g/dL (ref 6.0–8.5)
eGFR: 95 mL/min/{1.73_m2} (ref >59)

## 2021-06-19 LAB — TSH+FREE T4
Free T4: 1.51 ng/dL (ref 0.82–1.77)
TSH: 4.87 u[IU]/mL — ABNORMAL HIGH (ref 0.450–4.500)

## 2021-06-19 LAB — HIV ANTIBODY (ROUTINE TESTING W REFLEX): HIV Screen 4th Generation wRfx: NONREACTIVE

## 2021-06-19 LAB — HEPATITIS C ANTIBODY: Hep C Virus Ab: <0.1 s/co ratio (ref 0.0–0.9)

## 2021-07-02 ENCOUNTER — Other Ambulatory Visit: Payer: Self-pay

## 2021-07-02 ENCOUNTER — Encounter: Payer: Self-pay | Admitting: Internal Medicine

## 2021-07-02 ENCOUNTER — Ambulatory Visit (INDEPENDENT_AMBULATORY_CARE_PROVIDER_SITE_OTHER): Payer: Medicare Other | Admitting: Internal Medicine

## 2021-07-02 VITALS — BP 144/92 | HR 78 | Temp 98.3°F | Resp 18 | Ht 63.0 in | Wt 138.1 lb

## 2021-07-02 DIAGNOSIS — Z23 Encounter for immunization: Secondary | ICD-10-CM

## 2021-07-02 DIAGNOSIS — E039 Hypothyroidism, unspecified: Secondary | ICD-10-CM

## 2021-07-02 DIAGNOSIS — M810 Age-related osteoporosis without current pathological fracture: Secondary | ICD-10-CM

## 2021-07-02 DIAGNOSIS — I10 Essential (primary) hypertension: Secondary | ICD-10-CM | POA: Diagnosis not present

## 2021-07-02 DIAGNOSIS — F411 Generalized anxiety disorder: Secondary | ICD-10-CM

## 2021-07-02 MED ORDER — TELMISARTAN 20 MG PO TABS: 20.0000 mg | ORAL_TABLET | Freq: Every day | ORAL | 2 refills | Status: DC

## 2021-07-02 MED ORDER — LEVOTHYROXINE SODIUM 150 MCG PO TABS: 137.0000 ug | ORAL_TABLET | Freq: Every day | ORAL | 0 refills | Status: DC

## 2021-07-02 NOTE — Assessment & Plan Note (Signed)
BP Readings from Last 1 Encounters:  07/02/21 (!) 144/92   New onset, started telmisartan 20 mg once daily Counseled for compliance with the medications Advised DASH diet and moderate exercise/walking, at least 150 mins/week

## 2021-07-02 NOTE — Patient Instructions (Signed)
Please start taking Telmisartan for blood pressure.  Please follow DASH diet and perform moderate exercise/walking at least 150 mins/week.  Please start taking Levothyroxine 150 mcg instead of 137 mcg once daily.  Avoid taking Biotin. Please continue Calcium and Vitamin D supplements.

## 2021-07-02 NOTE — Assessment & Plan Note (Signed)
Well-controlled with Zoloft °
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
## 2021-07-02 NOTE — Assessment & Plan Note (Signed)
Did not tolerate Risedronate, has tried another bisphosphonate in the past Had started Prolia, but she did not pick it up - advised to start it as she is at high risk of compression fractures Continue calcium and vitamin D supplements

## 2021-07-02 NOTE — Assessment & Plan Note (Signed)
Lab Results  Component Value Date   TSH 4.870 (H) 06/18/2021   Increased dose of Levothyroxine to 150 mcg once daily Of note, she takes biotin supplements, advised to change vitamin supplement without biotin Check TSH and free T4

## 2021-07-15 ENCOUNTER — Other Ambulatory Visit: Payer: Self-pay | Admitting: *Deleted

## 2021-07-15 DIAGNOSIS — I10 Essential (primary) hypertension: Secondary | ICD-10-CM

## 2021-07-15 MED ORDER — TELMISARTAN 20 MG PO TABS: 20.0000 mg | ORAL_TABLET | Freq: Every day | ORAL | 2 refills | Status: DC

## 2021-07-27 ENCOUNTER — Other Ambulatory Visit: Payer: Self-pay | Admitting: Internal Medicine

## 2021-07-27 DIAGNOSIS — E039 Hypothyroidism, unspecified: Secondary | ICD-10-CM

## 2021-09-02 ENCOUNTER — Other Ambulatory Visit: Payer: Self-pay | Admitting: Internal Medicine

## 2021-09-02 DIAGNOSIS — E039 Hypothyroidism, unspecified: Secondary | ICD-10-CM

## 2021-09-23 ENCOUNTER — Other Ambulatory Visit: Payer: Self-pay | Admitting: Internal Medicine

## 2021-09-23 DIAGNOSIS — I10 Essential (primary) hypertension: Secondary | ICD-10-CM

## 2021-09-25 LAB — BASIC METABOLIC PANEL
BUN/Creatinine Ratio: 26 (ref 12–28)
BUN: 19 mg/dL (ref 8–27)
CO2: 25 mmol/L (ref 20–29)
Calcium: 9.7 mg/dL (ref 8.7–10.3)
Chloride: 100 mmol/L (ref 96–106)
Creatinine, Ser: 0.74 mg/dL (ref 0.57–1.00)
Glucose: 79 mg/dL (ref 70–99)
Potassium: 4.8 mmol/L (ref 3.5–5.2)
Sodium: 140 mmol/L (ref 134–144)
eGFR: 92 mL/min/{1.73_m2} (ref >59)

## 2021-09-25 LAB — TSH+FREE T4
Free T4: 1.95 ng/dL — ABNORMAL HIGH (ref 0.82–1.77)
TSH: 1.64 u[IU]/mL (ref 0.450–4.500)

## 2021-10-01 ENCOUNTER — Other Ambulatory Visit: Payer: Self-pay

## 2021-10-01 ENCOUNTER — Encounter: Payer: Self-pay | Admitting: Internal Medicine

## 2021-10-01 ENCOUNTER — Ambulatory Visit (INDEPENDENT_AMBULATORY_CARE_PROVIDER_SITE_OTHER): Payer: Medicare Other | Admitting: Internal Medicine

## 2021-10-01 VITALS — BP 132/84 | HR 73 | Resp 18 | Ht 63.0 in | Wt 136.1 lb

## 2021-10-01 DIAGNOSIS — E039 Hypothyroidism, unspecified: Secondary | ICD-10-CM | POA: Diagnosis not present

## 2021-10-01 DIAGNOSIS — Z981 Arthrodesis status: Secondary | ICD-10-CM | POA: Diagnosis not present

## 2021-10-01 DIAGNOSIS — Z23 Encounter for immunization: Secondary | ICD-10-CM

## 2021-10-01 DIAGNOSIS — G894 Chronic pain syndrome: Secondary | ICD-10-CM | POA: Diagnosis not present

## 2021-10-01 DIAGNOSIS — I10 Essential (primary) hypertension: Secondary | ICD-10-CM

## 2021-10-01 MED ORDER — PREGABALIN 50 MG PO CAPS
50.0000 mg | ORAL_CAPSULE | Freq: Every day | ORAL | 0 refills | Status: DC
Start: 2021-10-01 — End: 2022-04-01

## 2021-10-01 NOTE — Patient Instructions (Signed)
Please start taking Lyrica for neuropathic pain.  Please continue to take other medications as prescribed.  Please continue to follow low salt diet and perform moderate exercise/walking at least 150 mins/week.  You are being referred to Orthopedic surgeon for chronic back and knee pain.

## 2021-10-01 NOTE — Assessment & Plan Note (Addendum)
Lab Results  Component Value Date   TSH 1.640 09/24/2021   Well-controlled with Levothyroxine to 150 mcg once daily Check TSH and free T4 in the next visit
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
## 2021-10-01 NOTE — Assessment & Plan Note (Signed)
Worsening of low back pain with radiation to LE Referred to [facility]
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
## 2021-10-01 NOTE — Assessment & Plan Note (Signed)
BP Readings from Last 1 Encounters:  10/01/21 132/84   Well controlled with telmisartan 20 mg once daily now Counseled for compliance with the medications Advised DASH diet and moderate exercise/walking, at least 150 mins/week

## 2021-10-13 DIAGNOSIS — M5451 Vertebrogenic low back pain: Secondary | ICD-10-CM | POA: Diagnosis not present

## 2021-10-20 ENCOUNTER — Other Ambulatory Visit: Payer: Self-pay | Admitting: Orthopedic Surgery

## 2021-10-20 DIAGNOSIS — M5451 Vertebrogenic low back pain: Secondary | ICD-10-CM

## 2021-11-10 ENCOUNTER — Inpatient Hospital Stay: Admission: RE | Admit: 2021-11-10 | Payer: Medicare Other | Source: Ambulatory Visit

## 2021-11-10 ENCOUNTER — Other Ambulatory Visit: Payer: Medicare Other

## 2021-11-23 ENCOUNTER — Ambulatory Visit
Admission: RE | Admit: 2021-11-23 | Discharge: 2021-11-23 | Disposition: A | Discharge status: Home / Self Care | Payer: Medicare Other | Source: Ambulatory Visit | Attending: Orthopedic Surgery | Admitting: Orthopedic Surgery

## 2021-11-23 ENCOUNTER — Other Ambulatory Visit: Payer: Self-pay

## 2021-11-23 DIAGNOSIS — M5451 Vertebrogenic low back pain: Secondary | ICD-10-CM

## 2021-11-23 DIAGNOSIS — M545 Low back pain, unspecified: Secondary | ICD-10-CM | POA: Diagnosis not present

## 2021-12-25 DIAGNOSIS — M545 Low back pain, unspecified: Secondary | ICD-10-CM | POA: Diagnosis not present

## 2022-02-15 ENCOUNTER — Other Ambulatory Visit (HOSPITAL_COMMUNITY): Payer: Self-pay | Admitting: Obstetrics & Gynecology

## 2022-02-15 ENCOUNTER — Ambulatory Visit (INDEPENDENT_AMBULATORY_CARE_PROVIDER_SITE_OTHER): Payer: Medicare Other

## 2022-02-15 DIAGNOSIS — Z0000 Encounter for general adult medical examination without abnormal findings: Secondary | ICD-10-CM | POA: Diagnosis not present

## 2022-02-15 DIAGNOSIS — Z1231 Encounter for screening mammogram for malignant neoplasm of breast: Secondary | ICD-10-CM
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
## 2022-02-15 NOTE — Patient Instructions (Addendum)
Stephanie Elliott , ?Thank you for taking time to come for your Medicare Wellness Visit. I appreciate your ongoing commitment to your health goals. Please review the following plan we discussed and let me know if I can assist you in the future.  ? ?Screening recommendations/referrals: ?Colonoscopy: Complete ?Mammogram: Due now  ?Bone Density: Not due ?Recommended yearly ophthalmology/optometry visit for glaucoma screening and checkup ?Recommended yearly dental visit for hygiene and checkup ? ?Vaccinations: ?Influenza vaccine: Complete ?Pneumococcal vaccine: Not due  ?Tdap vaccine: Due now ?Shingles vaccine: Due now    ? ?Advanced directives:  ? ?Conditions/risks identified: Hypertension ? ?Next appointment: 1 year  ? ? ?Stephanie Elliott , ?Thank you for taking time to come for your Medicare Wellness Visit. I appreciate your ongoing commitment to your health goals. Please review the following plan we discussed and let me know if I can assist you in the future.  ? ?These are the goals we discussed: ? Goals   ? ?  Exercise 3x per week (30 min per time)   ?  I would like to get a treadmill  ?  ?  Patient Stated   ?  I would like to get my left knee replaced  ?  ?  Patient Stated   ?  Organize her father's home. ?  ? ?  ?  ?This is a list of the screening recommended for you and due dates:  ?Health Maintenance  ?Topic Date Due  ? Tetanus Vaccine  Never done  ? Zoster (Shingles) Vaccine (1 of 2) Never done  ? COVID-19 Vaccine (3 - Pfizer risk series) 10/16/2020  ? Mammogram  02/04/2022  ? Flu Shot  05/04/2022  ? Pap Smear  08/13/2023  ? Colon Cancer Screening  02/03/2026  ? Hepatitis C Screening: USPSTF Recommendation to screen - Ages 18-79 yo.  Completed  ? HIV Screening  Completed  ? HPV Vaccine  Aged Out  ?  ? ?Preventive Care 40-64 Years, Female ?Preventive care refers to lifestyle choices and visits with your health care provider that can promote health and wellness. ?What does preventive care include? ?A yearly physical  exam. This is also called an annual well check. ?Dental exams once or twice a year. ?Routine eye exams. Ask your health care provider how often you should have your eyes checked. ?Personal lifestyle choices, including: ?Daily care of your teeth and gums. ?Regular physical activity. ?Eating a healthy diet. ?Avoiding tobacco and drug use. ?Limiting alcohol use. ?Practicing safe sex. ?Taking low-dose aspirin daily starting at age 50. ?Taking vitamin and mineral supplements as recommended by your health care provider. ?What happens during an annual well check? ?The services and screenings done by your health care provider during your annual well check will depend on your age, overall health, lifestyle risk factors, and family history of disease. ?Counseling  ?Your health care provider may ask you questions about your: ?Alcohol use. ?Tobacco use. ?Drug use. ?Emotional well-being. ?Home and relationship well-being. ?Sexual activity. ?Eating habits. ?Work and work environment. ?Method of birth control. ?Menstrual cycle. ?Pregnancy history. ?Screening  ?You may have the following tests or measurements: ?Height, weight, and BMI. ?Blood pressure. ?Lipid and cholesterol levels. These may be checked every 5 years, or more frequently if you are over 50 years old. ?Skin check. ?Lung cancer screening. You may have this screening every year starting at age 55 if you have a 30-pack-year history of smoking and currently smoke or have quit within the past 15 years. ?Fecal occult blood test (  FOBT) of the stool. You may have this test every year starting at age 50. ?Flexible sigmoidoscopy or colonoscopy. You may have a sigmoidoscopy every 5 years or a colonoscopy every 10 years starting at age 50. ?Hepatitis C blood test. ?Hepatitis B blood test. ?Sexually transmitted disease (STD) testing. ?Diabetes screening. This is done by checking your blood sugar (glucose) after you have not eaten for a while (fasting). You may have this done  every 1-3 years. ?Mammogram. This may be done every 1-2 years. Talk to your health care provider about when you should start having regular mammograms. This may depend on whether you have a family history of breast cancer. ?BRCA-related cancer screening. This may be done if you have a family history of breast, ovarian, tubal, or peritoneal cancers. ?Pelvic exam and Pap test. This may be done every 3 years starting at age 21. Starting at age 30, this may be done every 5 years if you have a Pap test in combination with an HPV test. ?Bone density scan. This is done to screen for osteoporosis. You may have this scan if you are at high risk for osteoporosis. ?Discuss your test results, treatment options, and if necessary, the need for more tests with your health care provider. ?Vaccines  ?Your health care provider may recommend certain vaccines, such as: ?Influenza vaccine. This is recommended every year. ?Tetanus, diphtheria, and acellular pertussis (Tdap, Td) vaccine. You may need a Td booster every 10 years. ?Zoster vaccine. You may need this after age 60. ?Pneumococcal 13-valent conjugate (PCV13) vaccine. You may need this if you have certain conditions and were not previously vaccinated. ?Pneumococcal polysaccharide (PPSV23) vaccine. You may need one or two doses if you smoke cigarettes or if you have certain conditions. ?Talk to your health care provider about which screenings and vaccines you need and how often you need them. ?This information is not intended to replace advice given to you by your health care provider. Make sure you discuss any questions you have with your health care provider. ?Document Released: 10/17/2015 Document Revised: 06/09/2016 Document Reviewed: 07/22/2015 ?Elsevier Interactive Patient Education ? 2017 Else[employer] Inc[employer] ? ? ? ?Fall Prevention in the Home ?Falls can cause injuries. They can happen to people of all ages. There are many things you can do to make your home safe and to help  prevent falls. ?What can I do on the outside of my home? ?Regularly fix the edges of walkways and driveways and fix any cracks. ?Remove anything that might make you trip as you walk through a door, such as a raised step or threshold. ?Trim any bushes or trees on the path to your home. ?Use bright outdoor lighting. ?Clear any walking paths of anything that might make someone trip, such as rocks or tools. ?Regularly check to see if handrails are loose or broken. Make sure that both sides of any steps have handrails. ?Any raised decks and porches should have guardrails on the edges. ?Have any leaves, snow, or ice cleared regularly. ?Use sand or salt on walking paths during winter. ?Clean up any spills in your garage right away. This includes oil or grease spills. ?What can I do in the bathroom? ?Use night lights. ?Install grab bars by the toilet and in the tub and shower. Do not use towel bars as grab bars. ?Use non-skid mats or decals in the tub or shower. ?If you need to sit down in the shower, use a plastic, non-slip stool. ?Keep the floor dry. Clean up any   water that spills on the floor as soon as it happens. ?Remove soap buildup in the tub or shower regularly. ?Attach bath mats securely with double-sided non-slip rug tape. ?Do not have throw rugs and other things on the floor that can make you trip. ?What can I do in the bedroom? ?Use night lights. ?Make sure that you have a light by your bed that is easy to reach. ?Do not use any sheets or blankets that are too big for your bed. They should not hang down onto the floor. ?Have a firm chair that has side arms. You can use this for support while you get dressed. ?Do not have throw rugs and other things on the floor that can make you trip. ?What can I do in the kitchen? ?Clean up any spills right away. ?Avoid walking on wet floors. ?Keep items that you use a lot in easy-to-reach places. ?If you need to reach something above you, use a strong step stool that has a  grab bar. ?Keep electrical cords out of the way. ?Do not use floor polish or wax that makes floors slippery. If you must use wax, use non-skid floor wax. ?Do not have throw rugs and other things on the floor that can

## 2022-02-22 ENCOUNTER — Ambulatory Visit (HOSPITAL_COMMUNITY)
Admission: RE | Admit: 2022-02-22 | Discharge: 2022-02-22 | Disposition: A | Discharge status: Home / Self Care | Payer: Medicare Other | Source: Ambulatory Visit | Attending: Obstetrics & Gynecology | Admitting: Obstetrics & Gynecology

## 2022-02-22 DIAGNOSIS — Z1231 Encounter for screening mammogram for malignant neoplasm of breast: Secondary | ICD-10-CM | POA: Insufficient documentation

## 2022-03-03 DIAGNOSIS — M961 Postlaminectomy syndrome, not elsewhere classified: Secondary | ICD-10-CM | POA: Diagnosis not present

## 2022-03-21 ENCOUNTER — Other Ambulatory Visit: Payer: Self-pay | Admitting: Internal Medicine

## 2022-03-21 DIAGNOSIS — F419 Anxiety disorder, unspecified: Secondary | ICD-10-CM

## 2022-03-22 DIAGNOSIS — M9902 Segmental and somatic dysfunction of thoracic region: Secondary | ICD-10-CM | POA: Diagnosis not present

## 2022-03-22 DIAGNOSIS — M542 Cervicalgia: Secondary | ICD-10-CM | POA: Diagnosis not present

## 2022-03-22 DIAGNOSIS — M546 Pain in thoracic spine: Secondary | ICD-10-CM | POA: Diagnosis not present

## 2022-03-22 DIAGNOSIS — M9901 Segmental and somatic dysfunction of cervical region: Secondary | ICD-10-CM | POA: Diagnosis not present

## 2022-03-24 DIAGNOSIS — M9902 Segmental and somatic dysfunction of thoracic region: Secondary | ICD-10-CM | POA: Diagnosis not present

## 2022-03-24 DIAGNOSIS — M542 Cervicalgia: Secondary | ICD-10-CM | POA: Diagnosis not present

## 2022-03-24 DIAGNOSIS — M9901 Segmental and somatic dysfunction of cervical region: Secondary | ICD-10-CM | POA: Diagnosis not present

## 2022-03-24 DIAGNOSIS — M546 Pain in thoracic spine: Secondary | ICD-10-CM | POA: Diagnosis not present

## 2022-03-29 DIAGNOSIS — M9901 Segmental and somatic dysfunction of cervical region: Secondary | ICD-10-CM | POA: Diagnosis not present

## 2022-03-29 DIAGNOSIS — M546 Pain in thoracic spine: Secondary | ICD-10-CM | POA: Diagnosis not present

## 2022-03-29 DIAGNOSIS — M9902 Segmental and somatic dysfunction of thoracic region: Secondary | ICD-10-CM | POA: Diagnosis not present

## 2022-03-29 DIAGNOSIS — M542 Cervicalgia: Secondary | ICD-10-CM | POA: Diagnosis not present

## 2022-04-01 ENCOUNTER — Encounter: Payer: Self-pay | Admitting: Internal Medicine

## 2022-04-01 ENCOUNTER — Ambulatory Visit (INDEPENDENT_AMBULATORY_CARE_PROVIDER_SITE_OTHER): Payer: Medicare Other | Admitting: Internal Medicine

## 2022-04-01 VITALS — BP 118/72 | HR 82 | Resp 18 | Ht 63.0 in | Wt 137.6 lb

## 2022-04-01 DIAGNOSIS — M810 Age-related osteoporosis without current pathological fracture: Secondary | ICD-10-CM | POA: Diagnosis not present

## 2022-04-01 DIAGNOSIS — Z981 Arthrodesis status: Secondary | ICD-10-CM | POA: Diagnosis not present

## 2022-04-01 DIAGNOSIS — E782 Mixed hyperlipidemia: Secondary | ICD-10-CM

## 2022-04-01 DIAGNOSIS — I10 Essential (primary) hypertension: Secondary | ICD-10-CM

## 2022-04-01 DIAGNOSIS — Z889 Allergy status to unspecified drugs, medicaments and biological substances status: Secondary | ICD-10-CM

## 2022-04-01 DIAGNOSIS — E039 Hypothyroidism, unspecified: Secondary | ICD-10-CM

## 2022-04-01 DIAGNOSIS — E559 Vitamin D deficiency, unspecified: Secondary | ICD-10-CM

## 2022-04-01 DIAGNOSIS — Z0001 Encounter for general adult medical examination with abnormal findings: Secondary | ICD-10-CM | POA: Insufficient documentation

## 2022-04-01 MED ORDER — TELMISARTAN 20 MG PO TABS: 20.0000 mg | ORAL_TABLET | Freq: Every day | ORAL | 3 refills | Status: DC

## 2022-04-01 MED ORDER — FLUTICASONE PROPIONATE 50 MCG/ACT NA SUSP: 1.0000 | Freq: Every day | NASAL | 5 refills | Status: DC

## 2022-04-01 MED ORDER — CETIRIZINE HCL 10 MG PO TABS: 10.0000 mg | ORAL_TABLET | Freq: Every day | ORAL | 3 refills | Status: DC | PRN

## 2022-04-01 NOTE — Assessment & Plan Note (Signed)
Did not tolerate Risedronate, has tried another bisphosphonate in the past Had started Prolia, but she did not pick it up Continue calcium and vitamin D supplements

## 2022-04-01 NOTE — Assessment & Plan Note (Signed)
Worsening of low back pain with radiation to LE Followed by [facility] - planned to get spinal stimulator
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
## 2022-04-01 NOTE — Assessment & Plan Note (Signed)
Lab Results  Component Value Date   TSH 1.640 09/24/2021   Well-controlled with Levothyroxine to 150 mcg once daily Check TSH and free T4

## 2022-04-01 NOTE — Assessment & Plan Note (Signed)

## 2022-04-01 NOTE — Assessment & Plan Note (Addendum)
BP Readings from Last 1 Encounters:  04/01/22 118/72   Well controlled with telmisartan 20 mg once daily now Advised to take telmisartan in the evening as she has fatigue when she takes it in the morning Counseled for compliance with the medications Advised DASH diet and moderate exercise/walking, at least 150 mins/week

## 2022-04-01 NOTE — Patient Instructions (Signed)
Please continue taking medications as prescribed.  Please continue to follow low salt diet and ambulate as tolerated.  Please consider getting Tdap vaccine at your local pharmacy.

## 2022-04-01 NOTE — Assessment & Plan Note (Signed)
Uses Flonase PRN Takes Zyrtec PRN

## 2022-04-02 ENCOUNTER — Other Ambulatory Visit: Payer: Self-pay | Admitting: Internal Medicine

## 2022-04-02 DIAGNOSIS — E039 Hypothyroidism, unspecified: Secondary | ICD-10-CM

## 2022-04-02 LAB — CBC WITH DIFFERENTIAL/PLATELET
Basophils Absolute: 0.1 10*3/uL (ref 0.0–0.2)
Basos: 1 %
EOS (ABSOLUTE): 0.2 10*3/uL (ref 0.0–0.4)
Eos: 3 %
Hematocrit: 44.5 % (ref 34.0–46.6)
Hemoglobin: 15.4 g/dL (ref 11.1–15.9)
Immature Grans (Abs): 0 10*3/uL (ref 0.0–0.1)
Immature Granulocytes: 0 %
Lymphocytes Absolute: 1.4 10*3/uL (ref 0.7–3.1)
Lymphs: 28 %
MCH: 30.9 pg (ref 26.6–33.0)
MCHC: 34.6 g/dL (ref 31.5–35.7)
MCV: 89 fL (ref 79–97)
Monocytes Absolute: 0.5 10*3/uL (ref 0.1–0.9)
Monocytes: 9 %
Neutrophils Absolute: 3 10*3/uL (ref 1.4–7.0)
Neutrophils: 59 %
Platelets: 199 10*3/uL (ref 150–450)
RBC: 4.99 x10E6/uL (ref 3.77–5.28)
RDW: 12 % (ref 11.7–15.4)
WBC: 5.1 10*3/uL (ref 3.4–10.8)

## 2022-04-02 LAB — LIPID PANEL
Chol/HDL Ratio: 2.9 ratio (ref 0.0–4.4)
Cholesterol, Total: 190 mg/dL (ref 100–199)
HDL: 65 mg/dL (ref >39)
LDL Chol Calc (NIH): 110 mg/dL — ABNORMAL HIGH (ref 0–99)
Triglycerides: 81 mg/dL (ref 0–149)
VLDL Cholesterol Cal: 15 mg/dL (ref 5–40)

## 2022-04-02 LAB — CMP14+EGFR
ALT: 15 IU/L (ref 0–32)
AST: 20 IU/L (ref 0–40)
Albumin/Globulin Ratio: 2.4 — ABNORMAL HIGH (ref 1.2–2.2)
Albumin: 4.5 g/dL (ref 3.8–4.8)
Alkaline Phosphatase: 79 IU/L (ref 44–121)
BUN/Creatinine Ratio: 17 (ref 12–28)
BUN: 14 mg/dL (ref 8–27)
Bilirubin Total: 0.4 mg/dL (ref 0.0–1.2)
CO2: 25 mmol/L (ref 20–29)
Calcium: 9.7 mg/dL (ref 8.7–10.3)
Chloride: 103 mmol/L (ref 96–106)
Creatinine, Ser: 0.84 mg/dL (ref 0.57–1.00)
Globulin, Total: 1.9 g/dL (ref 1.5–4.5)
Glucose: 88 mg/dL (ref 70–99)
Potassium: 4.5 mmol/L (ref 3.5–5.2)
Sodium: 142 mmol/L (ref 134–144)
Total Protein: 6.4 g/dL (ref 6.0–8.5)
eGFR: 79 mL/min/{1.73_m2} (ref >59)

## 2022-04-02 LAB — TSH+FREE T4
Free T4: 2.03 ng/dL — ABNORMAL HIGH (ref 0.82–1.77)
TSH: 0.169 u[IU]/mL — ABNORMAL LOW (ref 0.450–4.500)

## 2022-04-02 LAB — HEMOGLOBIN A1C
Est. average glucose Bld gHb Est-mCnc: 105 mg/dL
Hgb A1c MFr Bld: 5.3 % (ref 4.8–5.6)

## 2022-04-02 LAB — VITAMIN D 25 HYDROXY (VIT D DEFICIENCY, FRACTURES): Vit D, 25-Hydroxy: 106 ng/mL — ABNORMAL HIGH (ref 30.0–100.0)

## 2022-04-02 MED ORDER — LEVOTHYROXINE SODIUM 137 MCG PO TABS: 137.0000 ug | ORAL_TABLET | Freq: Every day | ORAL | 3 refills | Status: DC

## 2022-04-09 ENCOUNTER — Ambulatory Visit (HOSPITAL_COMMUNITY)
Admission: RE | Admit: 2022-04-09 | Discharge: 2022-04-09 | Disposition: A | Discharge status: Home / Self Care | Payer: Medicare Other | Source: Ambulatory Visit | Attending: Internal Medicine | Admitting: Internal Medicine

## 2022-04-09 ENCOUNTER — Other Ambulatory Visit: Payer: Self-pay | Admitting: Internal Medicine

## 2022-04-09 DIAGNOSIS — M810 Age-related osteoporosis without current pathological fracture: Secondary | ICD-10-CM

## 2022-04-09 DIAGNOSIS — Z780 Asymptomatic menopausal state: Secondary | ICD-10-CM | POA: Diagnosis not present

## 2022-04-23 DIAGNOSIS — G5790 Unspecified mononeuropathy of unspecified lower limb: Secondary | ICD-10-CM | POA: Diagnosis not present

## 2022-04-23 DIAGNOSIS — G894 Chronic pain syndrome: Secondary | ICD-10-CM | POA: Diagnosis not present

## 2022-04-23 DIAGNOSIS — M5416 Radiculopathy, lumbar region: Secondary | ICD-10-CM | POA: Diagnosis not present

## 2022-05-03 DIAGNOSIS — M546 Pain in thoracic spine: Secondary | ICD-10-CM | POA: Insufficient documentation

## 2022-05-10 DIAGNOSIS — M546 Pain in thoracic spine: Secondary | ICD-10-CM | POA: Diagnosis not present

## 2022-05-26 DIAGNOSIS — M5451 Vertebrogenic low back pain: Secondary | ICD-10-CM | POA: Diagnosis not present

## 2022-05-31 ENCOUNTER — Ambulatory Visit (INDEPENDENT_AMBULATORY_CARE_PROVIDER_SITE_OTHER): Payer: Medicare Other | Admitting: Internal Medicine

## 2022-05-31 ENCOUNTER — Encounter: Payer: Self-pay | Admitting: Internal Medicine

## 2022-05-31 VITALS — BP 118/68 | HR 76 | Resp 18 | Ht 63.0 in | Wt 137.6 lb

## 2022-05-31 DIAGNOSIS — I10 Essential (primary) hypertension: Secondary | ICD-10-CM

## 2022-05-31 DIAGNOSIS — Z01818 Encounter for other preprocedural examination: Secondary | ICD-10-CM | POA: Diagnosis not present

## 2022-05-31 DIAGNOSIS — Z23 Encounter for immunization: Secondary | ICD-10-CM

## 2022-05-31 DIAGNOSIS — M961 Postlaminectomy syndrome, not elsewhere classified: Secondary | ICD-10-CM

## 2022-05-31 NOTE — Patient Instructions (Signed)
Please continue taking medications as prescribed.  Please continue to follow low salt diet and ambulate as tolerated. 
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
## 2022-06-03 DIAGNOSIS — Z01818 Encounter for other preprocedural examination: Secondary | ICD-10-CM | POA: Insufficient documentation

## 2022-06-03 NOTE — Assessment & Plan Note (Addendum)
RCRI: 0 DASI: 8.97  EKG: Sinus rhythm.  No signs of active ischemia. 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

## 2022-06-03 NOTE — Assessment & Plan Note (Signed)
BP Readings from Last 1 Encounters:  05/31/22 118/68   Well controlled with telmisartan 20 mg once daily now Counseled for compliance with the medications Advised DASH diet and moderate exercise/walking, at least 150 mins/week

## 2022-06-03 NOTE — Assessment & Plan Note (Signed)
Has chronic low back pain Planned for spinal stimulator placement

## 2022-06-04 ENCOUNTER — Ambulatory Visit (HOSPITAL_COMMUNITY): Payer: Self-pay | Admitting: Orthopedic Surgery

## 2022-06-10 NOTE — Pre-Procedure Instructions (Signed)
Surgical Instructions    Your procedure is scheduled on June 17, 2022.  Report to [facility] Main Entrance "A" at 5:30 A.M., then check in with the Admitting office.  Call this number if you have problems the morning of surgery:  [phone]   If you have any questions prior to your surgery date call [phone]: Open Monday-Friday 8am-4pm    Remember:  Do not eat or drink after midnight the night before your surgery      Take these medicines the morning of surgery with A SIP OF WATER:  fluticasone (FLONASE)  levothyroxine (SYNTHROID)  cetirizine (ZYRTEC) - may take as needed  Olopatadine HCl (PATADAY OP) - may take as needed   As of today, STOP taking any Aspirin (unless otherwise instructed by your surgeon) Aleve, Naproxen, Ibuprofen, Motrin, Advil, Goody's, BC's, all herbal medications, fish oil, and all vitamins.                     Do NOT Smoke (Tobacco/Vaping) for 24 hours prior to your procedure.  If you use a CPAP at night, you may bring your mask/headgear for your overnight stay.   Contacts, glasses, piercing's, hearing aid's, dentures or partials may not be worn into surgery, please bring cases for these belongings.    For patients admitted to the hospital, discharge time will be determined by your treatment team.   Patients discharged the day of surgery will not be allowed to drive home, and someone needs to stay with them for 24 hours.  SURGICAL WAITING ROOM VISITATION Patients having surgery or a procedure may have no more than 2 support people in the waiting area - these visitors may rotate.   Children under the age of 83 must have an adult with them who is not the patient. If the patient needs to stay at the hospital during part of their recovery, the visitor guidelines for inpatient rooms apply. Pre-op nurse will coordinate an appropriate time for 1 support person to accompany patient in pre-op.  This support person may not rotate.   Please refer to  the [facility] website for the visitor guidelines for Inpatients (after your surgery is over and you are in a regular room).    Special instructions:   [place]- Preparing For Surgery  Before surgery, you can play an important role. Because skin is not sterile, your skin needs to be as free of germs as possible. You can reduce the number of germs on your skin by washing with CHG (chlorahexidine gluconate) Soap before surgery.  CHG is an antiseptic cleaner which kills germs and bonds with the skin to continue killing germs even after washing.    Oral Hygiene is also important to reduce your risk of infection.  Remember - BRUSH YOUR TEETH THE MORNING OF SURGERY WITH YOUR REGULAR TOOTHPASTE  Please do not use if you have an allergy to CHG or antibacterial soaps. If your skin becomes reddened/irritated stop using the CHG.  Do not shave (including legs and underarms) for at least 48 hours prior to first CHG shower. It is OK to shave your face.  Please follow these instructions carefully.   Shower the NIGHT BEFORE SURGERY and the MORNING OF SURGERY  If you chose to wash your hair, wash your hair first as usual with your normal shampoo.  After you shampoo, rinse your hair and body thoroughly to remove the shampoo.  Use CHG Soap as you would any other liquid soap. You can apply CHG  directly to the skin and wash gently with a scrungie or a clean washcloth.   Apply the CHG Soap to your body ONLY FROM THE NECK DOWN.  Do not use on open wounds or open sores. Avoid contact with your eyes, ears, mouth and genitals (private parts). Wash Face and genitals (private parts)  with your normal soap.   Wash thoroughly, paying special attention to the area where your surgery will be performed.  Thoroughly rinse your body with warm water from the neck down.  DO NOT shower/wash with your normal soap after using and rinsing off the CHG Soap.  Pat yourself dry with a CLEAN TOWEL.  Wear CLEAN PAJAMAS to bed  the night before surgery  Place CLEAN SHEETS on your bed the night before your surgery  DO NOT SLEEP WITH PETS.   Day of Surgery: Take a shower with CHG soap. Do not wear jewelry or makeup Do not wear lotions, powders, perfumes/colognes, or deodorant. Do not shave 48 hours prior to surgery.   Do not bring valuables to the hospital.  [facility] is not responsible for any belongings or valuables. Do not wear nail polish, gel polish, artificial nails, or any other type of covering on natural nails (fingers and toes) If you have artificial nails or gel coating that need to be removed by a nail salon, please have this removed prior to surgery. Artificial nails or gel coating may interfere with anesthesia's ability to adequately monitor your vital signs.  Wear Clean/Comfortable clothing the morning of surgery Remember to brush your teeth WITH YOUR REGULAR TOOTHPASTE.   Please read over the following fact sheets that you were given.    If you received a COVID test during your pre-op visit  it is requested that you wear a mask when out in public, stay away from anyone that may not be feeling well and notify your surgeon if you develop symptoms. If you have been in contact with anyone that has tested positive in the last 10 days please notify you surgeon.

## 2022-06-11 ENCOUNTER — Encounter (HOSPITAL_COMMUNITY)
Admission: RE | Admit: 2022-06-11 | Discharge: 2022-06-11 | Disposition: A | Discharge status: Home / Self Care | Payer: Medicare Other | Source: Ambulatory Visit | Attending: Orthopedic Surgery | Admitting: Orthopedic Surgery

## 2022-06-11 ENCOUNTER — Other Ambulatory Visit: Payer: Self-pay

## 2022-06-11 ENCOUNTER — Encounter (HOSPITAL_COMMUNITY): Payer: Self-pay

## 2022-06-11 VITALS — BP 119/78 | HR 69 | Temp 98.2°F | Resp 18 | Ht 63.0 in | Wt 137.4 lb

## 2022-06-11 DIAGNOSIS — J3800 Paralysis of vocal cords and larynx, unspecified: Secondary | ICD-10-CM | POA: Diagnosis not present

## 2022-06-11 DIAGNOSIS — I10 Essential (primary) hypertension: Secondary | ICD-10-CM | POA: Diagnosis not present

## 2022-06-11 DIAGNOSIS — Z79890 Hormone replacement therapy (postmenopausal): Secondary | ICD-10-CM | POA: Diagnosis not present

## 2022-06-11 DIAGNOSIS — E039 Hypothyroidism, unspecified: Secondary | ICD-10-CM | POA: Diagnosis not present

## 2022-06-11 DIAGNOSIS — Z79899 Other long term (current) drug therapy: Secondary | ICD-10-CM | POA: Diagnosis not present

## 2022-06-11 DIAGNOSIS — Z01818 Encounter for other preprocedural examination: Secondary | ICD-10-CM

## 2022-06-11 DIAGNOSIS — Z87891 Personal history of nicotine dependence: Secondary | ICD-10-CM | POA: Diagnosis not present

## 2022-06-11 DIAGNOSIS — I2510 Atherosclerotic heart disease of native coronary artery without angina pectoris: Secondary | ICD-10-CM | POA: Diagnosis not present

## 2022-06-11 DIAGNOSIS — G8929 Other chronic pain: Secondary | ICD-10-CM | POA: Insufficient documentation

## 2022-06-11 DIAGNOSIS — Z9889 Other specified postprocedural states: Secondary | ICD-10-CM | POA: Insufficient documentation

## 2022-06-11 DIAGNOSIS — Z01812 Encounter for preprocedural laboratory examination: Secondary | ICD-10-CM | POA: Insufficient documentation

## 2022-06-11 HISTORY — DX: Essential (primary) hypertension: I10

## 2022-06-11 LAB — CBC
HCT: 42.8 % (ref 36.0–46.0)
Hemoglobin: 14.6 g/dL (ref 12.0–15.0)
MCH: 30.5 pg (ref 26.0–34.0)
MCHC: 34.1 g/dL (ref 30.0–36.0)
MCV: 89.5 fL (ref 80.0–100.0)
Platelets: 182 10*3/uL (ref 150–400)
RBC: 4.78 MIL/uL (ref 3.87–5.11)
RDW: 11.6 % (ref 11.5–15.5)
WBC: 4.7 10*3/uL (ref 4.0–10.5)
nRBC: 0 % (ref 0.0–0.2)

## 2022-06-11 LAB — SURGICAL PCR SCREEN
MRSA, PCR: NEGATIVE
Staphylococcus aureus: NEGATIVE

## 2022-06-11 LAB — BASIC METABOLIC PANEL
Anion gap: 5 (ref 5–15)
BUN: 14 mg/dL (ref 8–23)
CO2: 28 mmol/L (ref 22–32)
Calcium: 9.9 mg/dL (ref 8.9–10.3)
Chloride: 107 mmol/L (ref 98–111)
Creatinine, Ser: 0.86 mg/dL (ref 0.44–1.00)
GFR, Estimated: >60 mL/min (ref >60)
Glucose, Bld: 96 mg/dL (ref 70–99)
Potassium: 4.3 mmol/L (ref 3.5–5.1)
Sodium: 140 mmol/L (ref 135–145)

## 2022-06-11 NOTE — Progress Notes (Addendum)
PCP - Dr. Ihor Dow Cardiologist - Denies  PPM/ICD - Denies Device Orders - n/a Rep Notified - n/a  Chest x-ray - 09/10/2019 EKG - 8/28/202 Stress Test - Denies ECHO - Denies Cardiac Cath - Denies  Sleep Study - Denies CPAP - n/a  No DM  Blood Thinner Instructions: n/a Aspirin Instructions: n/a  NPO after midnight  COVID TEST- n/a   Anesthesia review: Yes. Difficult Intubation Hx. Discussed with Karoline Caldwell, PA-C  Patient denies shortness of breath, fever, cough and chest pain at PAT appointment   All instructions explained to the patient, with a verbal understanding of the material. Patient agrees to go over the instructions while at home for a better understanding. Patient also instructed to self quarantine after being tested for COVID-19. The opportunity to ask questions was provided.
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
## 2022-06-17 ENCOUNTER — Encounter (HOSPITAL_COMMUNITY): Payer: Self-pay | Admitting: Orthopedic Surgery

## 2022-06-17 ENCOUNTER — Observation Stay (HOSPITAL_COMMUNITY)
Admission: RE | Admit: 2022-06-17 | Discharge: 2022-06-18 | Disposition: A | Discharge status: Home / Self Care | Payer: Medicare Other | Attending: Orthopedic Surgery | Admitting: Orthopedic Surgery

## 2022-06-17 ENCOUNTER — Other Ambulatory Visit: Payer: Self-pay

## 2022-06-17 ENCOUNTER — Ambulatory Visit (HOSPITAL_COMMUNITY): Payer: Medicare Other

## 2022-06-17 ENCOUNTER — Ambulatory Visit (HOSPITAL_COMMUNITY): Payer: Medicare Other | Admitting: Physician Assistant

## 2022-06-17 ENCOUNTER — Encounter (HOSPITAL_COMMUNITY)
Admission: RE | Disposition: A | Discharge status: Home / Self Care | Payer: Self-pay | Source: Home / Self Care | Attending: Orthopedic Surgery

## 2022-06-17 ENCOUNTER — Ambulatory Visit (HOSPITAL_BASED_OUTPATIENT_CLINIC_OR_DEPARTMENT_OTHER): Payer: Medicare Other | Admitting: Physician Assistant

## 2022-06-17 DIAGNOSIS — Z969 Presence of functional implant, unspecified: Secondary | ICD-10-CM | POA: Diagnosis not present

## 2022-06-17 DIAGNOSIS — Z462 Encounter for fitting and adjustment of other devices related to nervous system and special senses: Secondary | ICD-10-CM | POA: Diagnosis not present

## 2022-06-17 DIAGNOSIS — G894 Chronic pain syndrome: Secondary | ICD-10-CM

## 2022-06-17 DIAGNOSIS — E039 Hypothyroidism, unspecified: Secondary | ICD-10-CM | POA: Diagnosis not present

## 2022-06-17 DIAGNOSIS — I10 Essential (primary) hypertension: Secondary | ICD-10-CM

## 2022-06-17 DIAGNOSIS — R262 Difficulty in walking, not elsewhere classified: Secondary | ICD-10-CM | POA: Diagnosis not present

## 2022-06-17 DIAGNOSIS — M961 Postlaminectomy syndrome, not elsewhere classified: Secondary | ICD-10-CM | POA: Diagnosis not present

## 2022-06-17 DIAGNOSIS — Z79899 Other long term (current) drug therapy: Secondary | ICD-10-CM | POA: Diagnosis not present

## 2022-06-17 DIAGNOSIS — G8929 Other chronic pain: Secondary | ICD-10-CM | POA: Diagnosis present

## 2022-06-17 DIAGNOSIS — Z87891 Personal history of nicotine dependence: Secondary | ICD-10-CM | POA: Diagnosis not present

## 2022-06-17 HISTORY — PX: SPINAL CORD STIMULATOR INSERTION: SHX5378

## 2022-06-17 SURGERY — INSERTION, SPINAL CORD STIMULATOR, LUMBAR
Anesthesia: General | Site: Spine Lumbar

## 2022-06-17 MED ORDER — SUGAMMADEX SODIUM 200 MG/2ML IV SOLN
INTRAVENOUS | Status: DC | PRN
  Administered 2022-06-17: 120 mg via INTRAVENOUS

## 2022-06-17 MED ORDER — SURGIFLO WITH THROMBIN (HEMOSTATIC MATRIX KIT) OPTIME
TOPICAL | Status: DC | PRN
  Administered 2022-06-17 (×2): 1 via TOPICAL

## 2022-06-17 MED ORDER — SODIUM CHLORIDE 0.9% FLUSH
3.0000 mL | Freq: Two times a day (BID) | INTRAVENOUS | Status: DC
  Administered 2022-06-17: 3 mL via INTRAVENOUS

## 2022-06-17 MED ORDER — LACTATED RINGERS IV SOLN: INTRAVENOUS | Status: DC

## 2022-06-17 MED ORDER — ACETAMINOPHEN 650 MG RE SUPP: 650.0000 mg | RECTAL | Status: DC | PRN

## 2022-06-17 MED ORDER — BUPIVACAINE-EPINEPHRINE 0.25% -1:200000 IJ SOLN
INTRAMUSCULAR | Status: DC | PRN
  Administered 2022-06-17: 20 mL

## 2022-06-17 MED ORDER — ACETAMINOPHEN 500 MG PO TABS: 1000.0000 mg | ORAL_TABLET | Freq: Once | ORAL | Status: AC

## 2022-06-17 MED ORDER — OXYCODONE HCL 5 MG/5ML PO SOLN: 5.0000 mg | Freq: Once | ORAL | Status: AC | PRN

## 2022-06-17 MED ORDER — HYDROMORPHONE HCL 1 MG/ML IJ SOLN
0.2500 mg | INTRAMUSCULAR | Status: DC | PRN
  Administered 2022-06-17 (×4): 0.5 mg via INTRAVENOUS

## 2022-06-17 MED ORDER — CEFAZOLIN SODIUM-DEXTROSE 2-4 GM/100ML-% IV SOLN
2.0000 g | INTRAVENOUS | Status: AC
  Administered 2022-06-17: 2 g via INTRAVENOUS

## 2022-06-17 MED ORDER — PROPOFOL 10 MG/ML IV BOLUS
INTRAVENOUS | Status: DC | PRN
  Administered 2022-06-17: 50 mg via INTRAVENOUS
  Administered 2022-06-17: 100 mg via INTRAVENOUS

## 2022-06-17 MED ORDER — MENTHOL 3 MG MT LOZG: 1.0000 | LOZENGE | OROMUCOSAL | Status: DC | PRN

## 2022-06-17 MED ORDER — SODIUM CHLORIDE 0.9% FLUSH: 3.0000 mL | INTRAVENOUS | Status: DC | PRN

## 2022-06-17 MED ORDER — CHLORHEXIDINE GLUCONATE 0.12 % MT SOLN: 15.0000 mL | Freq: Once | OROMUCOSAL | Status: AC

## 2022-06-17 MED ORDER — SODIUM CHLORIDE 0.9 % IV SOLN: INTRAVENOUS | Status: DC

## 2022-06-17 MED ORDER — ONDANSETRON HCL 4 MG PO TABS: 4.0000 mg | ORAL_TABLET | Freq: Four times a day (QID) | ORAL | Status: DC | PRN

## 2022-06-17 MED ORDER — ORAL CARE MOUTH RINSE: 15.0000 mL | Freq: Once | OROMUCOSAL | Status: AC

## 2022-06-17 MED ORDER — CEFAZOLIN SODIUM-DEXTROSE 2-4 GM/100ML-% IV SOLN
INTRAVENOUS | Status: AC
  Filled 2022-06-17: qty 100

## 2022-06-17 MED ORDER — ACETAMINOPHEN 500 MG PO TABS
ORAL_TABLET | ORAL | Status: AC
  Administered 2022-06-17: 1000 mg via ORAL
  Filled 2022-06-17: qty 2

## 2022-06-17 MED ORDER — EPHEDRINE SULFATE-NACL 50-0.9 MG/10ML-% IV SOSY
PREFILLED_SYRINGE | INTRAVENOUS | Status: DC | PRN
  Administered 2022-06-17 (×3): 5 mg via INTRAVENOUS

## 2022-06-17 MED ORDER — TRANEXAMIC ACID-NACL 1000-0.7 MG/100ML-% IV SOLN
1000.0000 mg | INTRAVENOUS | Status: AC
  Administered 2022-06-17: 1000 mg via INTRAVENOUS

## 2022-06-17 MED ORDER — 0.9 % SODIUM CHLORIDE (POUR BTL) OPTIME
TOPICAL | Status: DC | PRN
  Administered 2022-06-17: 1000 mL

## 2022-06-17 MED ORDER — FENTANYL CITRATE (PF) 250 MCG/5ML IJ SOLN
INTRAMUSCULAR | Status: DC | PRN
  Administered 2022-06-17 (×4): 50 ug via INTRAVENOUS

## 2022-06-17 MED ORDER — TRANEXAMIC ACID-NACL 1000-0.7 MG/100ML-% IV SOLN
INTRAVENOUS | Status: AC
  Filled 2022-06-17: qty 100

## 2022-06-17 MED ORDER — ONDANSETRON HCL 4 MG PO TABS: 4.0000 mg | ORAL_TABLET | Freq: Three times a day (TID) | ORAL | 0 refills | Status: DC | PRN

## 2022-06-17 MED ORDER — BUPIVACAINE-EPINEPHRINE (PF) 0.25% -1:200000 IJ SOLN
INTRAMUSCULAR | Status: AC
  Filled 2022-06-17: qty 30

## 2022-06-17 MED ORDER — ACETAMINOPHEN 325 MG PO TABS: 650.0000 mg | ORAL_TABLET | ORAL | Status: DC | PRN

## 2022-06-17 MED ORDER — FLEET ENEMA 7-19 GM/118ML RE ENEM: 1.0000 | ENEMA | Freq: Once | RECTAL | Status: DC | PRN

## 2022-06-17 MED ORDER — THROMBIN 20000 UNITS EX SOLR
CUTANEOUS | Status: AC
  Filled 2022-06-17: qty 20000

## 2022-06-17 MED ORDER — FENTANYL CITRATE (PF) 250 MCG/5ML IJ SOLN
INTRAMUSCULAR | Status: AC
  Filled 2022-06-17: qty 5

## 2022-06-17 MED ORDER — HYDROMORPHONE HCL 1 MG/ML IJ SOLN
INTRAMUSCULAR | Status: AC
  Filled 2022-06-17: qty 1

## 2022-06-17 MED ORDER — HYDROMORPHONE HCL 1 MG/ML IJ SOLN
0.5000 mg | INTRAMUSCULAR | Status: DC | PRN
  Administered 2022-06-17: 0.5 mg via INTRAVENOUS
  Filled 2022-06-17: qty 0.5

## 2022-06-17 MED ORDER — KETAMINE HCL 50 MG/5ML IJ SOSY
PREFILLED_SYRINGE | INTRAMUSCULAR | Status: AC
  Filled 2022-06-17: qty 5

## 2022-06-17 MED ORDER — LEVOTHYROXINE SODIUM 137 MCG PO TABS
137.0000 ug | ORAL_TABLET | Freq: Every day | ORAL | Status: DC
  Administered 2022-06-18: 137 ug via ORAL
  Filled 2022-06-17: qty 1

## 2022-06-17 MED ORDER — OXYCODONE HCL 5 MG PO TABS
10.0000 mg | ORAL_TABLET | ORAL | Status: DC | PRN
  Administered 2022-06-17 – 2022-06-18 (×5): 10 mg via ORAL
  Filled 2022-06-17 (×5): qty 2

## 2022-06-17 MED ORDER — METHOCARBAMOL 1000 MG/10ML IJ SOLN: 500.0000 mg | Freq: Four times a day (QID) | INTRAVENOUS | Status: DC | PRN

## 2022-06-17 MED ORDER — MIDAZOLAM HCL 2 MG/2ML IJ SOLN
INTRAMUSCULAR | Status: DC | PRN
  Administered 2022-06-17: 2 mg via INTRAVENOUS

## 2022-06-17 MED ORDER — DEXAMETHASONE SODIUM PHOSPHATE 10 MG/ML IJ SOLN
INTRAMUSCULAR | Status: DC | PRN
  Administered 2022-06-17: 10 mg via INTRAVENOUS

## 2022-06-17 MED ORDER — ROCURONIUM BROMIDE 10 MG/ML (PF) SYRINGE
PREFILLED_SYRINGE | INTRAVENOUS | Status: DC | PRN
  Administered 2022-06-17: 40 mg via INTRAVENOUS

## 2022-06-17 MED ORDER — CEFAZOLIN SODIUM-DEXTROSE 1-4 GM/50ML-% IV SOLN
1.0000 g | Freq: Three times a day (TID) | INTRAVENOUS | Status: AC
  Administered 2022-06-17 (×2): 1 g via INTRAVENOUS
  Filled 2022-06-17 (×2): qty 50

## 2022-06-17 MED ORDER — CHLORHEXIDINE GLUCONATE 0.12 % MT SOLN
OROMUCOSAL | Status: AC
  Administered 2022-06-17: 15 mL via OROMUCOSAL
  Filled 2022-06-17: qty 15

## 2022-06-17 MED ORDER — POLYETHYLENE GLYCOL 3350 17 G PO PACK: 17.0000 g | PACK | Freq: Every day | ORAL | Status: DC | PRN

## 2022-06-17 MED ORDER — METHOCARBAMOL 500 MG PO TABS: 500.0000 mg | ORAL_TABLET | Freq: Three times a day (TID) | ORAL | 0 refills | Status: AC | PRN

## 2022-06-17 MED ORDER — IRBESARTAN 150 MG PO TABS
75.0000 mg | ORAL_TABLET | Freq: Every day | ORAL | Status: DC
  Administered 2022-06-18: 75 mg via ORAL
  Filled 2022-06-17 (×2): qty 1

## 2022-06-17 MED ORDER — PROPOFOL 10 MG/ML IV BOLUS
INTRAVENOUS | Status: AC
  Filled 2022-06-17: qty 20

## 2022-06-17 MED ORDER — OXYCODONE HCL 5 MG PO TABS
5.0000 mg | ORAL_TABLET | Freq: Once | ORAL | Status: AC | PRN
  Administered 2022-06-17: 5 mg via ORAL

## 2022-06-17 MED ORDER — ONDANSETRON HCL 4 MG/2ML IJ SOLN
INTRAMUSCULAR | Status: DC | PRN
  Administered 2022-06-17: 4 mg via INTRAVENOUS

## 2022-06-17 MED ORDER — OXYCODONE HCL 5 MG PO TABS
ORAL_TABLET | ORAL | Status: AC
  Filled 2022-06-17: qty 1

## 2022-06-17 MED ORDER — MIDAZOLAM HCL 2 MG/2ML IJ SOLN
INTRAMUSCULAR | Status: AC
  Filled 2022-06-17: qty 2

## 2022-06-17 MED ORDER — AMISULPRIDE (ANTIEMETIC) 5 MG/2ML IV SOLN: 10.0000 mg | Freq: Once | INTRAVENOUS | Status: DC | PRN

## 2022-06-17 MED ORDER — PHENOL 1.4 % MT LIQD: 1.0000 | OROMUCOSAL | Status: DC | PRN

## 2022-06-17 MED ORDER — OXYCODONE HCL 5 MG PO TABS: 5.0000 mg | ORAL_TABLET | ORAL | Status: DC | PRN

## 2022-06-17 MED ORDER — LIDOCAINE 2% (20 MG/ML) 5 ML SYRINGE
INTRAMUSCULAR | Status: DC | PRN
  Administered 2022-06-17: 60 mg via INTRAVENOUS

## 2022-06-17 MED ORDER — OXYCODONE-ACETAMINOPHEN 10-325 MG PO TABS: 1.0000 | ORAL_TABLET | Freq: Four times a day (QID) | ORAL | 0 refills | Status: AC | PRN

## 2022-06-17 MED ORDER — SERTRALINE HCL 50 MG PO TABS
50.0000 mg | ORAL_TABLET | Freq: Every day | ORAL | Status: DC
  Administered 2022-06-17: 50 mg via ORAL
  Filled 2022-06-17: qty 1

## 2022-06-17 MED ORDER — KETAMINE HCL 10 MG/ML IJ SOLN
INTRAMUSCULAR | Status: DC | PRN
  Administered 2022-06-17: 10 mg via INTRAVENOUS
  Administered 2022-06-17: 40 mg via INTRAVENOUS

## 2022-06-17 MED ORDER — ONDANSETRON HCL 4 MG/2ML IJ SOLN: 4.0000 mg | Freq: Four times a day (QID) | INTRAMUSCULAR | Status: DC | PRN

## 2022-06-17 MED ORDER — THROMBIN 20000 UNITS EX SOLR: OROMUCOSAL | Status: DC | PRN

## 2022-06-17 MED ORDER — ONDANSETRON HCL 4 MG/2ML IJ SOLN: 4.0000 mg | Freq: Once | INTRAMUSCULAR | Status: DC | PRN

## 2022-06-17 MED ORDER — METHOCARBAMOL 500 MG PO TABS
500.0000 mg | ORAL_TABLET | Freq: Four times a day (QID) | ORAL | Status: DC | PRN
  Administered 2022-06-17 – 2022-06-18 (×3): 500 mg via ORAL
  Filled 2022-06-17 (×3): qty 1

## 2022-06-17 MED ORDER — SUCCINYLCHOLINE CHLORIDE 200 MG/10ML IV SOSY
PREFILLED_SYRINGE | INTRAVENOUS | Status: DC | PRN
  Administered 2022-06-17: 60 mg via INTRAVENOUS

## 2022-06-17 SURGICAL SUPPLY — 61 items
ANCHOR SWIFT LOCK IMPLANT
BAG COUNTER SPONGE SURGICOUNT (BAG) IMPLANT
CANISTER SUCT 3000ML PPV (MISCELLANEOUS) ×1 IMPLANT
CHARGER BATTERY NEUROSTIM ABT (NEUROSURGERY SUPPLIES) IMPLANT
CLSR STERI-STRIP ANTIMIC 1/2X4 (GAUZE/BANDAGES/DRESSINGS) ×1 IMPLANT
CONTROLLER NEUROSTIM PATIENT (NEUROSURGERY SUPPLIES) IMPLANT
COVER MAYO STAND STRL (DRAPES) ×1 IMPLANT
COVER SURGICAL LIGHT HANDLE (MISCELLANEOUS) ×1 IMPLANT
DRAIN CHANNEL 15F RND FF W/TCR (WOUND CARE) IMPLANT
DRAPE C-ARM 42X72 X-RAY (DRAPES) ×1 IMPLANT
DRAPE SURG 17X23 STRL (DRAPES) ×1 IMPLANT
DRAPE U-SHAPE 47X51 STRL (DRAPES) ×1 IMPLANT
DRSG OPSITE POSTOP 4X6 (GAUZE/BANDAGES/DRESSINGS) ×1 IMPLANT
DURAPREP 26ML APPLICATOR (WOUND CARE) ×1 IMPLANT
ELECT BLADE 4.0 EZ CLEAN MEGAD (MISCELLANEOUS)
ELECT CAUTERY BLADE 6.4 (BLADE) IMPLANT
ELECT PENCIL ROCKER SW 15FT (MISCELLANEOUS) ×1 IMPLANT
ELECT REM PT RETURN 9FT ADLT (ELECTROSURGICAL) ×1
ELECTRODE BLDE 4.0 EZ CLN MEGD (MISCELLANEOUS) IMPLANT
ELECTRODE REM PT RTRN 9FT ADLT (ELECTROSURGICAL) ×1 IMPLANT
GENERATOR NEUROSTIM ETERNA 16 (Generator) IMPLANT
GLOVE BIO SURGEON STRL SZ7 (GLOVE) ×1 IMPLANT
GLOVE BIOGEL PI IND STRL 7.0 (GLOVE) ×1 IMPLANT
GLOVE BIOGEL PI IND STRL 8.5 (GLOVE) ×1 IMPLANT
GLOVE SS N UNI LF 8.5 STRL (GLOVE) ×1 IMPLANT
GOWN STRL REUS W/ TWL LRG LVL3 (GOWN DISPOSABLE) ×2 IMPLANT
GOWN STRL REUS W/TWL 2XL LVL3 (GOWN DISPOSABLE) ×1 IMPLANT
GOWN STRL REUS W/TWL LRG LVL3 (GOWN DISPOSABLE) ×1
KIT BASIN OR (CUSTOM PROCEDURE TRAY) ×1 IMPLANT
KIT CHARGER NEUROSTIM ABT (NEUROSURGERY SUPPLIES) IMPLANT
KIT TURNOVER KIT B (KITS) ×1 IMPLANT
LEAD OCTRODE GEN 8CH 60CM (Lead) IMPLANT
MAGNET NEUROSTIM ETERNA PAT (NEUROSURGERY SUPPLIES) IMPLANT
MANUAL PATIENT NEUROSTIM DISP (MISCELLANEOUS) IMPLANT
NDL 22X1.5 STRL (OR ONLY) (MISCELLANEOUS) ×1 IMPLANT
NDL SPNL 18GX3.5 QUINCKE PK (NEEDLE) ×1 IMPLANT
NEEDLE 22X1.5 STRL (OR ONLY) (MISCELLANEOUS) ×1 IMPLANT
NEEDLE SPNL 18GX3.5 QUINCKE PK (NEEDLE) ×1 IMPLANT
NS IRRIG 1000ML POUR BTL (IV SOLUTION) ×1 IMPLANT
PACK LAMINECTOMY ORTHO (CUSTOM PROCEDURE TRAY) ×1 IMPLANT
PACK UNIVERSAL I (CUSTOM PROCEDURE TRAY) ×1 IMPLANT
PAD ARMBOARD 7.5X6 YLW CONV (MISCELLANEOUS) ×3 IMPLANT
SPATULA SILICONE BRAIN 10MM (MISCELLANEOUS) IMPLANT
SPONGE SURGIFOAM ABS GEL 100 (HEMOSTASIS) ×1 IMPLANT
SPONGE T-LAP 4X18 ~~LOC~~+RFID (SPONGE) IMPLANT
STAPLER VISISTAT 35W (STAPLE) IMPLANT
SURGIFLO W/THROMBIN 8M KIT (HEMOSTASIS) ×1 IMPLANT
SUT BONE WAX W31G (SUTURE) ×1 IMPLANT
SUT ETHIBOND 2 OS 4 DA (SUTURE) ×1 IMPLANT
SUT MNCRL AB 3-0 PS2 18 (SUTURE) ×2 IMPLANT
SUT MNCRL+ AB 3-0 CT1 36 (SUTURE) IMPLANT
SUT MONOCRYL AB 3-0 CT1 36IN (SUTURE) ×1
SUT VIC AB 1 CT1 18XCR BRD 8 (SUTURE) ×2 IMPLANT
SUT VIC AB 1 CT1 8-18 (SUTURE) ×2
SUT VIC AB 2-0 CT1 18 (SUTURE) ×1 IMPLANT
SYR BULB IRRIG 60ML STRL (SYRINGE) ×1 IMPLANT
SYR CONTROL 10ML LL (SYRINGE) ×1 IMPLANT
TOWEL GREEN STERILE (TOWEL DISPOSABLE) ×1 IMPLANT
TOWEL GREEN STERILE FF (TOWEL DISPOSABLE) ×1 IMPLANT
WATER STERILE IRR 1000ML POUR (IV SOLUTION) ×1 IMPLANT
YANKAUER SUCT BULB TIP NO VENT (SUCTIONS) ×1 IMPLANT

## 2022-06-17 NOTE — Anesthesia Postprocedure Evaluation (Signed)
Anesthesia Post Note  Patient: Stephanie Elliott  Procedure(s) Performed: SPINAL CORD STIMULATOR INSERTION (Spine Lumbar)     Patient location during evaluation: PACU Anesthesia Type: General Level of consciousness: awake and alert Pain management: pain level controlled Vital Signs Assessment: post-procedure vital signs reviewed and stable Respiratory status: spontaneous breathing, nonlabored ventilation and respiratory function stable Cardiovascular status: blood pressure returned to baseline and stable Postop Assessment: no apparent nausea or vomiting Anesthetic complications: yes   Encounter Notable Events  Notable Event Outcome Phase Comment  Difficult to intubate - expected  Intraprocedure Filed from anesthesia note documentation.    Last Vitals:  Vitals:   06/17/22 1240 06/17/22 1348  BP: 109/65 114/76  Pulse: 83 76  Resp: 16 16  Temp:  36.5 C  SpO2: 91% 95%    Last Pain:  Vitals:   06/17/22 1348  TempSrc: Oral  PainSc:                  Lidia Collum

## 2022-06-17 NOTE — Discharge Instructions (Signed)

## 2022-06-17 NOTE — Brief Op Note (Signed)
06/17/2022  10:05 AM  PATIENT:  Stephanie Elliott  61 y.o. female  PRE-OPERATIVE DIAGNOSIS:  Chronic pain failed back syndrome  POST-OPERATIVE DIAGNOSIS:  Chronic pain failed back syndrome  PROCEDURE:  Procedure(s) with comments: SPINAL CORD STIMULATOR INSERTION (N/A) - 2.5 hrs 3 C-bed  SURGEON:  Surgeon(s) and Role:    Melina Schools, MD - Primary  PHYSICIAN ASSISTANT:   ASSISTANTS: none   ANESTHESIA:   general  EBL:  50 mL   BLOOD ADMINISTERED:none  DRAINS: none   LOCAL MEDICATIONS USED:  MARCAINE     SPECIMEN:  No Specimen  DISPOSITION OF SPECIMEN:  N/A  COUNTS:  YES  TOURNIQUET:  * No tourniquets in log *  DICTATION: .Dragon Dictation  PLAN OF CARE: Admit for overnight observation  PATIENT DISPOSITION:  PACU - hemodynamically stable.

## 2022-06-17 NOTE — Transfer of Care (Signed)
Immediate Anesthesia Transfer of Care Note  Patient: Stephanie Elliott  Procedure(s) Performed: SPINAL CORD STIMULATOR INSERTION (Spine Lumbar)  Patient Location: PACU  Anesthesia Type:General  Level of Consciousness: drowsy and patient cooperative  Airway & Oxygen Therapy: Patient Spontanous Breathing and Patient connected to face mask oxygen  Post-op Assessment: Report given to RN, Post -op Vital signs reviewed and stable and Patient moving all extremities X 4  Post vital signs: Reviewed and stable  Last Vitals:  Vitals Value Taken Time  BP 102/81 06/17/22 1005  Temp 36.5 C 06/17/22 1005  Pulse 93 06/17/22 1011  Resp 18 06/17/22 1008  SpO2 98 % 06/17/22 1011  Vitals shown include unvalidated device data.  Last Pain:  Vitals:   06/17/22 0621  TempSrc:   PainSc: 3       Patients Stated Pain Goal: 1 (23/95/32 0233)  Complications:  Encounter Notable Events  Notable Event Outcome Phase Comment  Difficult to intubate - expected  Intraprocedure Filed from anesthesia note documentation.

## 2022-06-17 NOTE — Op Note (Signed)
OPERATIVE REPORT  DATE OF SURGERY: 06/17/2022  PATIENT NAME:  SEMIRA STOLTZFUS MRN: [id number] DOB: 10/19/1960  PCP: Lindell Spar, MD  PRE-OPERATIVE DIAGNOSIS: Chronic pain syndrome status post spine surgery and lower extremity ORIF for fracture.  Patient has had successful spinal cord stimulator trial  POST-OPERATIVE DIAGNOSIS: Same  PROCEDURE:   Spinal cord stimulator permanent implantation  SURGEON:  Melina Schools, MD  PHYSICIAN ASSISTANT: None  ANESTHESIA:   General  EBL: 50 ml   Complications: None  Implants: Octrode lead x2.  Eterna pulse generator  BRIEF HISTORY: GIANA CASTNER is a 61 y.o. female who has had prior lumbar spinal fusion surgery as well as ORIF of the pelvis and lower extremity.  She has had chronic posttraumatic pain.  She recently had a spinal cord stimulator trial and had improvement in quality of life and reduction in pain.  As result of the successful trial we elected to move forward with permanent implantation.  All appropriate risks, benefits, and alternatives to surgery were discussed with the patient and consent was obtained.  PROCEDURE DETAILS: Patient was brought into the operating room and was properly positioned on the operating room table.  After induction with general anesthesia the patient was endotracheally intubated.  A timeout was taken to confirm all important data: including patient, procedure, and the level. Teds, SCD's were applied.   The patient was turned prone onto the Wilson frame and the thoracolumbar spine was prepped and draped in a standard fashion.  Using fluoroscopy identified the T10 and T12 pedicles and marked out my midline incision.  I infiltrated this with quarter percent Marcaine with epinephrine.  The battery incision site was also infiltrated with quarter percent Marcaine.  A midline thoracic incision was made and sharp dissection was carried out down to the deep fascia.  I incised the deep fascia and stripped it to  expose the inferior portion of the T10 and the entire T11 spinous process.  Using fluoroscopy identified the T10-11 disc space.  I then used a double-action Leksell rongeur to remove the interspinous process ligament and the inferior third of the spinous process of T10.  A laminotomy was performed with a 2 mm Kerrison rongeur.  Using a pituitary rongeurs I gently dissected through the central raphae of the ligamentum flavum and then resected it with a 1 mm Kerrison rongeur.  The dorsal aspect of the thecal sac was now visualized.  I then obtained the first lead and gently passed it just to the left of midline.  Once I confirmed satisfactory positioning on AP and lateral planes and with the representative from the spinal cord stimulator company I then placed a second.  This lead was also midline and slightly to the left.  The majority of the patient's pain was on the left side.  Imaging in both planes confirmed satisfactory positioning of both leads.  I confirmed with the [employer] representative that it the leads were properly positioned to cover the same area that was covered during the trial.  I then secured the leads with the locking lead holders to the T11 spinous process with Ethibond suture.  I then passed them through T12 11 interspinous process ligament in order to secure them before passing to the battery site.  The battery site was incised and I created a pocket approximately 2 cm deep.  I then used the submuscular passer to send the leads from the thoracic wound to the gluteal wound.  The leads were then secured to the  battery and tightened according Web designer.  The battery was placed into the pocket and the excess leads were placed underneath the battery.  Battery was then secured to the deep fascia with two #1 Vicryl sutures.  At this point the [employer] representative then tested the entire system and it was functioning without issue.  Both wounds were then copiously irrigated with normal  saline and then I closed the deep fascia of both wounds with interrupted #1 Vicryl sutures.  Then I used a layer of interrupted 2-0 Vicryl suture and finally 3-0 Monocryl for the skin.  Steri-Strips and dry dressings were applied and the patient was ultimately extubated and transferred the PACU without incident.  The end of the case all needle sponge counts were correct.  There were no adverse intraoperative events.  Melina Schools, MD 06/17/2022 9:57 AM
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
## 2022-06-17 NOTE — Anesthesia Procedure Notes (Signed)
Procedure Name: Intubation Date/Time: 06/17/2022 7:57 AM  Performed by: Darletta Moll, CRNAPre-anesthesia Checklist: Patient identified, Emergency Drugs available, Suction available and Patient being monitored Patient Re-evaluated:Patient Re-evaluated prior to induction Oxygen Delivery Method: Circle system utilized Preoxygenation: Pre-oxygenation with 100% oxygen Induction Type: IV induction Ventilation: Mask ventilation without difficulty Laryngoscope Size: Glidescope and 3 Grade View: Grade II Tube type: MLT Tube size: 5.0 mm Number of attempts: 1 Airway Equipment and Method: Stylet and Oral airway Placement Confirmation: ETT inserted through vocal cords under direct vision, positive ETCO2 and breath sounds checked- equal and bilateral Secured at: 22 cm Tube secured with: Tape Dental Injury: Teeth and Oropharynx as per pre-operative assessment  Difficulty Due To: Difficulty was anticipated, Difficult Airway- due to anterior larynx and Difficult Airway- due to limited oral opening Future Recommendations: Recommend- induction with short-acting agent, and alternative techniques readily available

## 2022-06-18 DIAGNOSIS — E039 Hypothyroidism, unspecified: Secondary | ICD-10-CM | POA: Diagnosis not present

## 2022-06-18 DIAGNOSIS — R262 Difficulty in walking, not elsewhere classified: Secondary | ICD-10-CM | POA: Diagnosis not present

## 2022-06-18 DIAGNOSIS — G894 Chronic pain syndrome: Secondary | ICD-10-CM | POA: Diagnosis not present

## 2022-06-18 DIAGNOSIS — Z79899 Other long term (current) drug therapy: Secondary | ICD-10-CM | POA: Diagnosis not present

## 2022-06-18 DIAGNOSIS — I10 Essential (primary) hypertension: Secondary | ICD-10-CM | POA: Diagnosis not present

## 2022-06-18 NOTE — Evaluation (Signed)
Physical Therapy Evaluation Patient Details Name: Stephanie Elliott MRN: [id number] DOB: [birth year] Today's Date: 06/18/2022  History of Present Illness  61 yo female lives alone now s/p spinal cord stimular PMH anxiety, chronic pain syndrome, CRPS, depression, HTN, osteoporsis, PNA,  Clinical Impression  Patient presents with pain and post surgical deficits s/p above surgery. Pt lives at home alone with her dog and cat and reports being independent for ADLs and ambulation PTA. Today, pt requires Min guard assist for bed mobility, transfers and gait training with use of RW for support. Pt reports she can borrow a RW at home. Will have assist from her grandson this weekend. Reviewed back precautions, log roll technique, positioning, walking program etc. Will follow acutely to maximize independence and mobility prior to return home.       Recommendations for follow up therapy are one component of a multi-disciplinary discharge planning process, led by the attending physician.  Recommendations may be updated based on patient status, additional functional criteria and insurance authorization.  Follow Up Recommendations No PT follow up      Assistance Recommended at Discharge Intermittent Supervision/Assistance  Patient can return home with the following  A little help with walking and/or transfers;A little help with bathing/dressing/bathroom;Help with stairs or ramp for entrance;Assist for transportation;Assistance with cooking/housework    Equipment Recommendations None recommended by PT  Recommendations for Other Services       Functional Status Assessment Patient has had a recent decline in their functional status and demonstrates the ability to make significant improvements in function in a reasonable and predictable amount of time.     Precautions / Restrictions Precautions Precautions: Fall Precaution Comments: back with handout provided for adls Restrictions Weight Bearing  Restrictions: No      Mobility  Bed Mobility Overal bed mobility: Needs Assistance Bed Mobility: Rolling, Sidelying to Sit, Sit to Sidelying Rolling: Supervision Sidelying to sit: Supervision   Sit to supine: Supervision   General bed mobility comments: HOB flat to simulate home, cues for log roll technique, increased time/effort.    Transfers Overall transfer level: Needs assistance Equipment used: Rolling walker (2 wheels) Transfers: Sit to/from Stand Sit to Stand: Min guard           General transfer comment: Min guard for safety. Stood from [employer], good demo of hand placement.    Ambulation/Gait Ambulation/Gait assistance: Min guard Gait Distance (Feet): 350 Feet Assistive device: Rolling walker (2 wheels) Gait Pattern/deviations: Step-through pattern, Decreased stride length Gait velocity: decreased Gait velocity interpretation: 1.31 - 2.62 ft/sec, indicative of limited community ambulator   General Gait Details: Slow, guarded and steady gait with RW for support. No spasms during walking.  Stairs            Wheelchair Mobility    Modified Rankin (Stroke Patients Only)       Balance Overall balance assessment: Needs assistance Sitting-balance support: Feet supported, No upper extremity supported Sitting balance-Leahy Scale: Good     Standing balance support: During functional activity, Reliant on assistive device for balance Standing balance-Leahy Scale: Poor                               Pertinent Vitals/Pain Pain Assessment Pain Assessment: 0-10 Pain Score: 8  Pain Location: back Pain Descriptors / Indicators: Sore, Operative site guarding Pain Intervention(s): Monitored during session, Repositioned    Home Living Family/patient expects to be discharged to:: Private residence Living Arrangements:  Alone Available Help at Discharge: Available PRN/intermittently;Family Type of Home: House Home Access: Stairs to  enter Entrance Stairs-Rails: Psychiatric nurse of Steps: 2   Home Layout: Able to live on main level with bedroom/bathroom Home Equipment: Rollator (4 wheels);Shower seat;BSC/3in1;Grab bars - toilet;Hand held shower head;Adaptive equipment Additional Comments: has a dog and cat that she has to take care of. she has a 82 yo grandson that is watching animals at her house currently and will check on her after school next week. Son is staying at her house over the weekend. Monday she will be home alone    Prior Function Prior Level of Function : Independent/Modified Independent;Driving             Mobility Comments: independent ADLs Comments: independent     Hand Dominance   Dominant Hand: Right    Extremity/Trunk Assessment   Upper Extremity Assessment Upper Extremity Assessment: Defer to OT evaluation RUE Deficits / Details: pain with R UE shoulder flexion and increased spasms    Lower Extremity Assessment Lower Extremity Assessment: Overall WFL for tasks assessed (reports permanent nerve damage in LEs)    Cervical / Trunk Assessment Cervical / Trunk Assessment: Back Surgery  Communication   Communication: No difficulties  Cognition Arousal/Alertness: Awake/alert Behavior During Therapy: WFL for tasks assessed/performed Overall Cognitive Status: Within Functional Limits for tasks assessed                                          General Comments General comments (skin integrity, edema, etc.): Bandage-clean, dry and intact    Exercises     Assessment/Plan    PT Assessment Patient needs continued PT services  PT Problem List Decreased mobility;Pain;Decreased balance;Decreased knowledge of precautions;Decreased skin integrity;Decreased activity tolerance       PT Treatment Interventions Therapeutic activities;Gait training;Therapeutic exercise;Patient/family education;Balance training;Stair training;Functional mobility training     PT Goals (Current goals can be found in the Care Plan section)  Acute Rehab PT Goals Patient Stated Goal: to improve pain PT Goal Formulation: With patient Time For Goal Achievement: 07/02/22 Potential to Achieve Goals: Good    Frequency Min 5X/week     Co-evaluation               AM-PAC PT "6 Clicks" Mobility  Outcome Measure Help needed turning from your back to your side while in a flat bed without using bedrails?: A Little Help needed moving from lying on your back to sitting on the side of a flat bed without using bedrails?: A Little Help needed moving to and from a bed to a chair (including a wheelchair)?: A Little Help needed standing up from a chair using your arms (e.g., wheelchair or bedside chair)?: A Little Help needed to walk in hospital room?: A Little Help needed climbing 3-5 steps with a railing? : A Little 6 Click Score: 18    End of Session Equipment Utilized During Treatment: Gait belt Activity Tolerance: Patient tolerated treatment well Patient left: with call bell/phone within reach;in bed Nurse Communication: Mobility status PT Visit Diagnosis: Pain;Difficulty in walking, not elsewhere classified (R26.2) Pain - part of body:  (back)    Time: 1517-6160 PT Time Calculation (min) (ACUTE ONLY): 15 min   Charges:   PT Evaluation $PT Eval Moderate Complexity: 1 Mod          Kaivon Livesey H, PT, DPT Acute Rehabilitation Services Secure  chat preferred Office [place] 06/18/2022, 11:35 AM

## 2022-06-18 NOTE — Evaluation (Signed)
Occupational Therapy Evaluation Patient Details Name: Stephanie Elliott MRN: [id number] DOB: 1961-06-03 Today's Date: 06/18/2022   History of Present Illness 61 yo female lives alone now s/p spinal cord stimular PMH anxiety, chronic pain syndrome, CRPS, depression, HTN, osteoporsis, PNA,   Clinical Impression   Patient is s/p spinal cord simulator surgery resulting in functional limitations due to the deficits listed below (see OT problem list). Pt currently with elevated pain limiting progression of session. Pt was dressed and OT will provide second session to progress patient with premedication again. Notified PT need to evaluate due to limited progression with ambulation and RW placed in room to help with upright posture. Pt will have son at her home over the weekend but will be indep at home alone starting Monday morning. Pt has to let the dog in and out of the house and feed cat/dog daily. Grandson (71 yo) will visit after school during the week next week.   Patient will benefit from skilled OT acutely to increase independence and safety with ADLS to allow discharge [place].       Recommendations for follow up therapy are one component of a multi-disciplinary discharge planning process, led by the attending physician.  Recommendations may be updated based on patient status, additional functional criteria and insurance authorization.   Follow Up Recommendations  Home health OT    Assistance Recommended at Discharge Set up Supervision/Assistance  Patient can return home with the following A little help with walking and/or transfers;A little help with bathing/dressing/bathroom;Assist for transportation    Functional Status Assessment  Patient has had a recent decline in their functional status and demonstrates the ability to make significant improvements in function in a reasonable and predictable amount of time.  Equipment Recommendations  None recommended by OT    Recommendations for Other  Services PT consult     Precautions / Restrictions Precautions Precautions: Fall Precaution Comments: back with handout provided for adls      Mobility Bed Mobility Overal bed mobility: Needs Assistance Bed Mobility: Rolling, Supine to Sit, Sit to Supine Rolling: Min assist   Supine to sit: Mod assist Sit to supine: Mod assist   General bed mobility comments: educated on log roll and needs (A) to elevate trunk and descend trunk. pt with (A) for LB to return to supine. pt requires use of rails    Transfers Overall transfer level: Needs assistance Equipment used: Rolling walker (2 wheels) Transfers: Sit to/from Stand Sit to Stand: Min assist, From elevated surface           General transfer comment: surface elevated to help with successful sit<>Stand to don pants/ underwear      Balance Overall balance assessment: Needs assistance, Mild deficits observed, not formally tested Sitting-balance support: Bilateral upper extremity supported, Feet supported Sitting balance-Leahy Scale: Fair     Standing balance support: Bilateral upper extremity supported, During functional activity, Reliant on assistive device for balance Standing balance-Leahy Scale: Poor                             ADL either performed or assessed with clinical judgement   ADL Overall ADL's : Needs assistance/impaired Eating/Feeding: Modified independent               Upper Body Dressing : Moderate assistance   Lower Body Dressing: Moderate assistance   Toilet Transfer: Moderate assistance             General  ADL Comments: pt very painful with dressing. pt able to cross figure 4 but limited by spasms. Pt requires (A) to thread shirt due to R side pain with any arm flexion. pt limited to bed level care and will provide further treatment after next medication dose     Vision Baseline Vision/History:  (wears contacts)       Perception     Praxis      Pertinent  Vitals/Pain Pain Assessment Pain Assessment: Faces Pain Score: 10-Worst pain ever Faces Pain Scale: Hurts worst Pain Location: back spasms Pain Descriptors / Indicators: Spasm Pain Intervention(s): Monitored during session, Premedicated before session, Repositioned, Limited activity within patient's tolerance     Hand Dominance Right   Extremity/Trunk Assessment Upper Extremity Assessment Upper Extremity Assessment: RUE deficits/detail RUE Deficits / Details: pain with R UE shoulder flexion and increased spasms   Lower Extremity Assessment Lower Extremity Assessment: Defer to PT evaluation   Cervical / Trunk Assessment Cervical / Trunk Assessment: Back Surgery   Communication Communication Communication: No difficulties   Cognition Arousal/Alertness: Awake/alert Behavior During Therapy: WFL for tasks assessed/performed Overall Cognitive Status: Within Functional Limits for tasks assessed                                       General Comments  dressing dry and intact at this time.    Exercises     Shoulder Instructions      Home Living Family/patient expects to be discharged to:: Private residence Living Arrangements: Alone Available Help at Discharge: Available PRN/intermittently;Family Type of Home: House Home Access: Stairs to enter [employer] of Steps: 2 Entrance Stairs-Rails: Right;Left Home Layout: Able to live on main level with bedroom/bathroom     Bathroom Shower/Tub: Teacher, early years/pre: Handicapped height     Home Equipment: Rollator (4 wheels);Shower seat;BSC/3in1;Grab bars - toilet;Hand held shower head;Adaptive equipment Adaptive Equipment: Reacher Additional Comments: has a dog and cat that she has to take care of. she has a 74 yo grandson that is watching animals at her house currently and will check on her after school next week. Son is staying at her house over the weekend. Monday she will be home  alone      Prior Functioning/Environment Prior Level of Function : Independent/Modified Independent;Driving                        OT Problem List: Decreased activity tolerance;Impaired balance (sitting and/or standing);Decreased safety awareness;Decreased knowledge of use of DME or AE;Decreased knowledge of precautions;Pain      OT Treatment/Interventions: Self-care/ADL training;Therapeutic exercise;Neuromuscular education;Energy conservation;DME and/or AE instruction;Manual therapy;Modalities;Therapeutic activities;Patient/family education;Balance training    OT Goals(Current goals can be found in the care plan section) Acute Rehab OT Goals Patient Stated Goal: to have more medication- RN notified and pending time to allow additional medications OT Goal Formulation: With patient Time For Goal Achievement: 07/02/22 Potential to Achieve Goals: Good  OT Frequency: Min 2X/week    Co-evaluation              AM-PAC OT "6 Clicks" Daily Activity     Outcome Measure Help from another person eating meals?: A Little Help from another person taking care of personal grooming?: A Lot Help from another person toileting, which includes using toliet, bedpan, or urinal?: A Lot Help from another person bathing (including washing, rinsing, drying)?: A Lot  Help from another person to put on and taking off regular upper body clothing?: A Lot Help from another person to put on and taking off regular lower body clothing?: A Lot 6 Click Score: 13   End of Session Equipment Utilized During Treatment: Rolling walker (2 wheels) Nurse Communication: Mobility status;Precautions  Activity Tolerance: Patient limited by pain Patient left: in bed;with call bell/phone within reach  OT Visit Diagnosis: Unsteadiness on feet (R26.81);Muscle weakness (generalized) (M62.81);Pain                Time: 0947-0962 OT Time Calculation (min): 30 min Charges:  OT General Charges $OT Visit: 1 Visit OT  Evaluation $OT Eval Moderate Complexity: 1 Mod OT Treatments $Self Care/Home Management : 8-22 mins   Brynn, OTR/L  Acute Rehabilitation Services Office: [phone] .   Jeri Modena 06/18/2022, 9:45 AM

## 2022-06-18 NOTE — Progress Notes (Signed)
    Subjective: Procedure(s) (LRB): SPINAL CORD STIMULATOR INSERTION (N/A) 1 Day Post-Op  Patient reports pain as 4 on 0-10 scale.  Reports decreased leg pain reports incisional back pain   Positive void Negative bowel movement Positive flatus Negative chest pain or shortness of breath  Objective: Vital signs in last 24 hours: Temp:  [97.5 F (36.4 C)-99.6 F (37.6 C)] 99.2 F (37.3 C) (09/15 0714) Pulse Rate:  [63-97] 80 (09/15 0714) Resp:  [11-18] 18 (09/15 0714) BP: (98-120)/(53-87) 98/53 (09/15 0714) SpO2:  [91 %-100 %] 97 % (09/15 0714) FiO2 (%):  [21 %] 21 % (09/14 1348)  Intake/Output from previous day: 09/14 0701 - 09/15 0700 In: 1684.6 [P.O.:240; I.V.:1194.6; IV Piggyback:250] Out: 50 [Blood:50]  Labs: No results for input(s): "WBC", "RBC", "HCT", "PLT" in the last 72 hours. No results for input(s): "NA", "K", "CL", "CO2", "BUN", "CREATININE", "GLUCOSE", "CALCIUM" in the last 72 hours. No results for input(s): "LABPT", "INR" in the last 72 hours.  Physical Exam: Neurologically intact ABD soft Intact pulses distally Incision: dressing C/D/I and no drainage Compartment soft Body mass index is 24.27 kg/m.   Assessment/Plan: Patient stable  xrays n/a Continue mobilization with physical therapy Continue care  Patient complains of muscle cramps/spasms at the thoracic incision site.  Otherwise she is stable.  She is neurologically intact with no focal deficits.  At this point time we will have her work with physical therapy.  I do think with mobilization her spasms will improve.  Discharged home once she is cleared by PT/OT.  Melina Schools, MD [facility] [phone]

## 2022-06-18 NOTE — Progress Notes (Signed)
Occupational Therapy Treatment Patient Details Name: Stephanie Elliott MRN: [id number] DOB: 06/27/1961 Today's Date: 06/18/2022   History of present illness 61 yo female lives alone now s/p spinal cord stimular PMH anxiety, chronic pain syndrome, CRPS, depression, HTN, osteoporsis, PNA,   OT comments  Pt progressing toward goals this session with decreased muscle spasms. Pt demonstrates improved bed mobility toward the L side of the bed to simulate home. Pt completed grooming task prior to OT arrival. Ot educated on back precautions and use of clean linens. Pt plans to sponge bath at home  so advised the first transfer to be with son fully clothed to practice. Son was present for this education. Recommendation for [facility]    Recommendations for follow up therapy are one component of a multi-disciplinary discharge planning process, led by the attending physician.  Recommendations may be updated based on patient status, additional functional criteria and insurance authorization.    Follow Up Recommendations  Home health OT    Assistance Recommended at Discharge Set up Supervision/Assistance  Patient can return home with the following  A little help with walking and/or transfers;A little help with bathing/dressing/bathroom;Assist for transportation   Equipment Recommendations  None recommended by OT    Recommendations for Other Services PT consult    Precautions / Restrictions Precautions Precautions: Fall Precaution Comments: back precautions with adls. Restrictions Weight Bearing Restrictions: No       Mobility Bed Mobility Overal bed mobility: Needs Assistance Bed Mobility: Rolling, Supine to Sit Rolling: Supervision   Supine to sit: Supervision Sit to supine: Mod assist   General bed mobility comments: exiting the left side of the bed to simulate home. increased improvement with L side (decr pain compared to R side) cues for log rolling    Transfers Overall transfer level:  Needs assistance Equipment used: Rolling walker (2 wheels) Transfers: Sit to/from Stand Sit to Stand: Min guard           General transfer comment: using bil hands to power up from bed surface to simulate home surface     Balance Overall balance assessment: Needs assistance, Mild deficits observed, not formally tested Sitting-balance support: Bilateral upper extremity supported, Feet supported Sitting balance-Leahy Scale: Good     Standing balance support: Bilateral upper extremity supported, During functional activity, Reliant on assistive device for balance Standing balance-Leahy Scale: Poor                             ADL either performed or assessed with clinical judgement   ADL Overall ADL's : Needs assistance/impaired Eating/Feeding: Modified independent               Upper Body Dressing : Moderate assistance   Lower Body Dressing: Moderate assistance   Toilet Transfer: Moderate assistance             General ADL Comments: educated on animal care at home with increased handle length,and seated position. Possible use of automative funnel to help with pouring food / water in bowls. Pt with increased indep with bed mobility after additional practice. educated on upright in chair with 90 degree angle. Rn arriving for d/c paperwork    Extremity/Trunk Assessment Upper Extremity Assessment Upper Extremity Assessment: Overall WFL for tasks assessed RUE Deficits / Details: pain with R UE shoulder flexion and increased spasms   Lower Extremity Assessment Lower Extremity Assessment: Overall WFL for tasks assessed (reports permanent nerve damage in LEs)   Cervical /  Trunk Assessment Cervical / Trunk Assessment: Back Surgery    Vision Baseline Vision/History:  (wears contacts)     Perception     Praxis      Cognition Arousal/Alertness: Awake/alert Behavior During Therapy: WFL for tasks assessed/performed Overall Cognitive Status: Within  Functional Limits for tasks assessed                                          Exercises      Shoulder Instructions       General Comments Bandage-clean, dry and intact    Pertinent Vitals/ Pain       Pain Assessment Pain Assessment: Faces Pain Score: 10-Worst pain ever Faces Pain Scale: Hurts a little bit Pain Location: back Pain Descriptors / Indicators: Sore Pain Intervention(s): Premedicated before session, Repositioned  Home Living Family/patient expects to be discharged to:: Private residence Living Arrangements: Alone Available Help at Discharge: Available PRN/intermittently;Family Type of Home: House Home Access: Stairs to enter [employer] of Steps: 2 Entrance Stairs-Rails: Right;Left Home Layout: Able to live on main level with bedroom/bathroom     Bathroom Shower/Tub: Teacher, early years/pre: Handicapped height     Home Equipment: Rollator (4 wheels);Shower seat;BSC/3in1;Grab bars - toilet;Hand held shower head;Adaptive equipment Adaptive Equipment: Reacher Additional Comments: has a dog and cat that she has to take care of. she has a 17 yo grandson that is watching animals at her house currently and will check on her after school next week. Son is staying at her house over the weekend. Monday she will be home alone      Prior Functioning/Environment              Frequency  Min 2X/week        Progress Toward Goals  OT Goals(current goals can now be found in the care plan section)  Progress towards OT goals: Progressing toward goals  Acute Rehab OT Goals Patient Stated Goal: to go home today OT Goal Formulation: With patient Time For Goal Achievement: 07/02/22 Potential to Achieve Goals: Good ADL Goals Pt Will Transfer to Toilet: with modified independence;ambulating;bedside commode Additional ADL Goal #1: pt will complete bed mobility mod I as precursor to adls. Additional ADL Goal #2: Pt will  demonstrate sink level adls with 650% back precautions  Plan Discharge plan remains appropriate    Co-evaluation                 AM-PAC OT "6 Clicks" Daily Activity     Outcome Measure   Help from another person eating meals?: A Little Help from another person taking care of personal grooming?: A Little Help from another person toileting, which includes using toliet, bedpan, or urinal?: A Little Help from another person bathing (including washing, rinsing, drying)?: A Little Help from another person to put on and taking off regular upper body clothing?: A Little Help from another person to put on and taking off regular lower body clothing?: A Little 6 Click Score: 18    End of Session Equipment Utilized During Treatment: Rolling walker (2 wheels)  OT Visit Diagnosis: Unsteadiness on feet (R26.81);Muscle weakness (generalized) (M62.81);Pain   Activity Tolerance Patient tolerated treatment well   Patient Left in chair;with call bell/phone within reach;with family/visitor present;with nursing/sitter in room   Nurse Communication Mobility status;Precautions        Time: 1132 (3546)-5681 OT Time Calculation (min):  10 min  Charges: OT General Charges $OT Visit: 1 Visit OT Evaluation $OT Eval Moderate Complexity: 1 Mod OT Treatments $Self Care/Home Management : 8-22 mins   Brynn, OTR/L  Acute Rehabilitation Services Office: [phone] .   Jeri Modena 06/18/2022, 11:58 AM

## 2022-06-20 ENCOUNTER — Encounter (HOSPITAL_COMMUNITY): Payer: Self-pay | Admitting: Orthopedic Surgery

## 2022-06-21 ENCOUNTER — Telehealth: Payer: Self-pay | Admitting: *Deleted

## 2022-06-21 NOTE — Telephone Encounter (Signed)
Transition Care Management Unsuccessful Follow-up Telephone Call  Date of discharge and from where:  06-18-22 Stephanie [facility]   Attempts:  1st Attempt  Reason for unsuccessful TCM follow-up call:  Left voice message
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
## 2022-06-28 ENCOUNTER — Ambulatory Visit (INDEPENDENT_AMBULATORY_CARE_PROVIDER_SITE_OTHER): Payer: Medicare Other | Admitting: Family Medicine

## 2022-06-28 ENCOUNTER — Encounter: Payer: Self-pay | Admitting: Family Medicine

## 2022-06-28 DIAGNOSIS — U071 COVID-19: Secondary | ICD-10-CM | POA: Diagnosis not present

## 2022-06-28 DIAGNOSIS — R058 Other specified cough: Secondary | ICD-10-CM

## 2022-06-28 MED ORDER — DEXTROMETHORPHAN HBR 15 MG/5ML PO SYRP: 10.0000 mL | ORAL_SOLUTION | Freq: Four times a day (QID) | ORAL | 0 refills | Status: DC | PRN

## 2022-06-28 MED ORDER — NIRMATRELVIR/RITONAVIR (PAXLOVID)TABLET: 3.0000 | ORAL_TABLET | Freq: Two times a day (BID) | ORAL | 0 refills | Status: AC
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
## 2022-06-29 ENCOUNTER — Telehealth: Payer: Medicare Other | Admitting: Family Medicine

## 2022-08-30 DIAGNOSIS — M5451 Vertebrogenic low back pain: Secondary | ICD-10-CM | POA: Diagnosis not present

## 2022-09-07 DIAGNOSIS — M5451 Vertebrogenic low back pain: Secondary | ICD-10-CM | POA: Diagnosis not present

## 2022-09-10 DIAGNOSIS — M5451 Vertebrogenic low back pain: Secondary | ICD-10-CM | POA: Diagnosis not present

## 2022-09-14 DIAGNOSIS — M5451 Vertebrogenic low back pain: Secondary | ICD-10-CM | POA: Diagnosis not present

## 2022-09-16 DIAGNOSIS — M5451 Vertebrogenic low back pain: Secondary | ICD-10-CM | POA: Diagnosis not present

## 2022-09-21 DIAGNOSIS — M5451 Vertebrogenic low back pain: Secondary | ICD-10-CM | POA: Diagnosis not present

## 2022-10-01 DIAGNOSIS — M5451 Vertebrogenic low back pain: Secondary | ICD-10-CM | POA: Diagnosis not present

## 2022-10-05 ENCOUNTER — Ambulatory Visit (INDEPENDENT_AMBULATORY_CARE_PROVIDER_SITE_OTHER): Payer: Medicare Other | Admitting: Internal Medicine

## 2022-10-05 ENCOUNTER — Encounter: Payer: Self-pay | Admitting: Internal Medicine

## 2022-10-05 VITALS — BP 137/89 | HR 69 | Ht 63.0 in | Wt 131.2 lb

## 2022-10-05 DIAGNOSIS — E039 Hypothyroidism, unspecified: Secondary | ICD-10-CM

## 2022-10-05 DIAGNOSIS — F411 Generalized anxiety disorder: Secondary | ICD-10-CM

## 2022-10-05 DIAGNOSIS — M79672 Pain in left foot: Secondary | ICD-10-CM | POA: Diagnosis not present

## 2022-10-05 DIAGNOSIS — M961 Postlaminectomy syndrome, not elsewhere classified: Secondary | ICD-10-CM | POA: Diagnosis not present

## 2022-10-05 DIAGNOSIS — M25872 Other specified joint disorders, left ankle and foot: Secondary | ICD-10-CM | POA: Insufficient documentation

## 2022-10-05 DIAGNOSIS — M7742 Metatarsalgia, left foot: Secondary | ICD-10-CM | POA: Insufficient documentation

## 2022-10-05 DIAGNOSIS — I10 Essential (primary) hypertension: Secondary | ICD-10-CM

## 2022-10-05 NOTE — Assessment & Plan Note (Signed)
Has chronic low back pain S/p spinal stimulator placement Undergoing PT

## 2022-10-05 NOTE — Assessment & Plan Note (Signed)
Well-controlled with Zoloft °

## 2022-10-05 NOTE — Assessment & Plan Note (Signed)
Lab Results  Component Value Date   TSH 0.169 (L) 04/01/2022   On Levothyroxine to 137 mcg once daily now Check TSH and free T4
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
## 2022-10-05 NOTE — Assessment & Plan Note (Signed)
BP Readings from Last 1 Encounters:  10/05/22 137/89   Well controlled with telmisartan 20 mg once daily now Counseled for compliance with the medications Advised DASH diet and moderate exercise/walking, at least 150 mins/week

## 2022-10-05 NOTE — Assessment & Plan Note (Signed)
Likely due to callus, although she c/o pain upon sitting as well Referred to Podiatry

## 2022-10-05 NOTE — Patient Instructions (Signed)
Please continue taking medications as prescribed.  Please continue to follow low salt diet and ambulate as tolerated. 

## 2022-10-06 ENCOUNTER — Other Ambulatory Visit: Payer: Self-pay | Admitting: Internal Medicine

## 2022-10-06 DIAGNOSIS — E039 Hypothyroidism, unspecified: Secondary | ICD-10-CM

## 2022-10-06 LAB — BASIC METABOLIC PANEL
BUN/Creatinine Ratio: 12 (ref 12–28)
BUN: 9 mg/dL (ref 8–27)
CO2: 25 mmol/L (ref 20–29)
Calcium: 10.2 mg/dL (ref 8.7–10.3)
Chloride: 104 mmol/L (ref 96–106)
Creatinine, Ser: 0.73 mg/dL (ref 0.57–1.00)
Glucose: 86 mg/dL (ref 70–99)
Potassium: 4.7 mmol/L (ref 3.5–5.2)
Sodium: 142 mmol/L (ref 134–144)
eGFR: 93 mL/min/{1.73_m2} (ref >59)

## 2022-10-06 LAB — TSH+FREE T4
Free T4: 1.97 ng/dL — ABNORMAL HIGH (ref 0.82–1.77)
TSH: 0.177 u[IU]/mL — ABNORMAL LOW (ref 0.450–4.500)

## 2022-10-06 MED ORDER — LEVOTHYROXINE SODIUM 125 MCG PO TABS: 125.0000 ug | ORAL_TABLET | Freq: Every day | ORAL | 1 refills | Status: DC

## 2022-10-08 DIAGNOSIS — M5451 Vertebrogenic low back pain: Secondary | ICD-10-CM | POA: Diagnosis not present

## 2022-10-12 DIAGNOSIS — M5451 Vertebrogenic low back pain: Secondary | ICD-10-CM | POA: Diagnosis not present

## 2022-10-14 DIAGNOSIS — M5451 Vertebrogenic low back pain: Secondary | ICD-10-CM | POA: Diagnosis not present

## 2022-10-18 DIAGNOSIS — M5451 Vertebrogenic low back pain: Secondary | ICD-10-CM | POA: Diagnosis not present

## 2022-10-19 ENCOUNTER — Ambulatory Visit (INDEPENDENT_AMBULATORY_CARE_PROVIDER_SITE_OTHER): Payer: Medicare Other

## 2022-10-19 ENCOUNTER — Encounter: Payer: Self-pay | Admitting: Podiatry

## 2022-10-19 ENCOUNTER — Ambulatory Visit (INDEPENDENT_AMBULATORY_CARE_PROVIDER_SITE_OTHER): Payer: Medicare Other | Admitting: Podiatry

## 2022-10-19 VITALS — BP 140/86 | HR 69

## 2022-10-19 DIAGNOSIS — M25872 Other specified joint disorders, left ankle and foot: Secondary | ICD-10-CM | POA: Diagnosis not present

## 2022-10-19 DIAGNOSIS — M778 Other enthesopathies, not elsewhere classified: Secondary | ICD-10-CM | POA: Diagnosis not present

## 2022-10-19 DIAGNOSIS — G5792 Unspecified mononeuropathy of left lower limb: Secondary | ICD-10-CM | POA: Diagnosis not present

## 2022-10-19 DIAGNOSIS — M19072 Primary osteoarthritis, left ankle and foot: Secondary | ICD-10-CM

## 2022-10-19 MED ORDER — TRIAMCINOLONE ACETONIDE 40 MG/ML IJ SUSP
20.0000 mg | Freq: Once | INTRAMUSCULAR | Status: AC
  Administered 2022-10-19: 20 mg
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
## 2022-10-20 DIAGNOSIS — M5451 Vertebrogenic low back pain: Secondary | ICD-10-CM | POA: Diagnosis not present

## 2022-10-25 DIAGNOSIS — M5451 Vertebrogenic low back pain: Secondary | ICD-10-CM | POA: Diagnosis not present

## 2022-10-26 ENCOUNTER — Encounter: Payer: Self-pay | Admitting: Internal Medicine

## 2022-10-26 ENCOUNTER — Ambulatory Visit (INDEPENDENT_AMBULATORY_CARE_PROVIDER_SITE_OTHER): Payer: 59 | Admitting: Internal Medicine

## 2022-10-26 VITALS — BP 119/78 | HR 81 | Ht 63.0 in | Wt 134.0 lb

## 2022-10-26 DIAGNOSIS — I10 Essential (primary) hypertension: Secondary | ICD-10-CM | POA: Diagnosis not present

## 2022-10-26 DIAGNOSIS — Z23 Encounter for immunization: Secondary | ICD-10-CM

## 2022-10-26 DIAGNOSIS — I739 Peripheral vascular disease, unspecified: Secondary | ICD-10-CM | POA: Insufficient documentation

## 2022-10-26 NOTE — Assessment & Plan Note (Signed)
At home screening [employer] test positive for PVD on LLE Check [place] LLE arterial study for PAD BP WNL currently Does not have diabetes or history of CVA/CAD, slightly elevated LDL Considering her history of multiple orthopedic procedures on LLE, would be interesting to check association with PVD

## 2022-10-26 NOTE — Assessment & Plan Note (Signed)
BP Readings from Last 1 Encounters:  10/26/22 119/78   Well controlled with telmisartan 20 mg once daily now Counseled for compliance with the medications Advised DASH diet and moderate exercise/walking, at least 150 mins/week

## 2022-10-26 NOTE — Patient Instructions (Signed)
Please continue taking medications as prescribed.  You are being scheduled to get [place] of left leg.
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
## 2022-10-27 DIAGNOSIS — M5451 Vertebrogenic low back pain: Secondary | ICD-10-CM | POA: Diagnosis not present

## 2022-11-01 DIAGNOSIS — M5451 Vertebrogenic low back pain: Secondary | ICD-10-CM | POA: Diagnosis not present

## 2022-11-02 ENCOUNTER — Telehealth: Payer: Self-pay | Admitting: Internal Medicine

## 2022-11-02 NOTE — Telephone Encounter (Signed)
Spoke to patient

## 2022-11-02 NOTE — Telephone Encounter (Signed)
Pt called stating that radiology told her they have not received the order for the [place]. Can you please resend?

## 2022-11-03 DIAGNOSIS — M5451 Vertebrogenic low back pain: Secondary | ICD-10-CM | POA: Diagnosis not present

## 2022-11-04 ENCOUNTER — Ambulatory Visit: Payer: 59 | Admitting: Internal Medicine

## 2022-11-05 ENCOUNTER — Ambulatory Visit (HOSPITAL_COMMUNITY)
Admission: RE | Admit: 2022-11-05 | Discharge: 2022-11-05 | Disposition: A | Discharge status: Home / Self Care | Payer: 59 | Source: Ambulatory Visit | Attending: Internal Medicine | Admitting: Internal Medicine

## 2022-11-05 DIAGNOSIS — I739 Peripheral vascular disease, unspecified: Secondary | ICD-10-CM | POA: Diagnosis not present

## 2022-11-05 DIAGNOSIS — I743 Embolism and thrombosis of arteries of the lower extremities: Secondary | ICD-10-CM | POA: Diagnosis not present

## 2022-11-09 ENCOUNTER — Ambulatory Visit (INDEPENDENT_AMBULATORY_CARE_PROVIDER_SITE_OTHER): Payer: 59 | Admitting: Podiatry

## 2022-11-09 DIAGNOSIS — G5792 Unspecified mononeuropathy of left lower limb: Secondary | ICD-10-CM

## 2022-11-09 DIAGNOSIS — M7742 Metatarsalgia, left foot: Secondary | ICD-10-CM | POA: Diagnosis not present

## 2022-11-10 NOTE — Progress Notes (Signed)
  Subjective:  Patient ID: Stephanie Elliott, female    DOB: [birth year],  MRN: [id number]  Chief Complaint  Patient presents with   Foot Pain    3 week follow up left sesamoiditis - [place] patient - pain got better after the injection but now is back    62 y.o. female presents with the above complaint. History confirmed with patient.  Injection only lasted a day or 2  Objective:  Physical Exam: warm, good capillary refill, no trophic changes or ulcerative lesions, normal DP and PT pulses, normal sensory exam, and diffuse discomfort in the ball of the foot into the metatarsals, subjective paresthesias and she feels like it is tight across the front of the foot.  No pain on sesamoids today  Assessment:   1. Neuritis of left foot   2. Metatarsalgia, left foot      Plan:  Patient was evaluated and treated and all questions answered.  I suspect most of her symptoms now are likely metatarsalgia secondary to metatarsal fat head thinning as well as peripheral neuropathy and neuritis secondary to lumbar radiculopathy.  I recommended supporting cushioning of foot with a metatarsal silicone pad which was dispensed.  We discussed treatment of the neuritic symptoms including using gabapentin she has had this before but it did not help her.  She return to see [place] as needed  Return if symptoms worsen or fail to improve.

## 2022-12-10 ENCOUNTER — Other Ambulatory Visit: Payer: Self-pay | Admitting: Internal Medicine

## 2022-12-10 DIAGNOSIS — E039 Hypothyroidism, unspecified: Secondary | ICD-10-CM

## 2023-02-08 ENCOUNTER — Other Ambulatory Visit: Payer: Self-pay | Admitting: Internal Medicine

## 2023-02-08 DIAGNOSIS — F419 Anxiety disorder, unspecified: Secondary | ICD-10-CM

## 2023-02-14 DIAGNOSIS — M546 Pain in thoracic spine: Secondary | ICD-10-CM | POA: Diagnosis not present

## 2023-02-14 DIAGNOSIS — M9901 Segmental and somatic dysfunction of cervical region: Secondary | ICD-10-CM | POA: Diagnosis not present

## 2023-02-14 DIAGNOSIS — M542 Cervicalgia: Secondary | ICD-10-CM | POA: Diagnosis not present

## 2023-02-14 DIAGNOSIS — M9902 Segmental and somatic dysfunction of thoracic region: Secondary | ICD-10-CM | POA: Diagnosis not present

## 2023-02-16 DIAGNOSIS — M542 Cervicalgia: Secondary | ICD-10-CM | POA: Diagnosis not present

## 2023-02-16 DIAGNOSIS — M546 Pain in thoracic spine: Secondary | ICD-10-CM | POA: Diagnosis not present

## 2023-02-16 DIAGNOSIS — M9902 Segmental and somatic dysfunction of thoracic region: Secondary | ICD-10-CM | POA: Diagnosis not present

## 2023-02-16 DIAGNOSIS — M9901 Segmental and somatic dysfunction of cervical region: Secondary | ICD-10-CM | POA: Diagnosis not present

## 2023-02-18 DIAGNOSIS — M9902 Segmental and somatic dysfunction of thoracic region: Secondary | ICD-10-CM | POA: Diagnosis not present

## 2023-02-18 DIAGNOSIS — M546 Pain in thoracic spine: Secondary | ICD-10-CM | POA: Diagnosis not present

## 2023-02-18 DIAGNOSIS — M9901 Segmental and somatic dysfunction of cervical region: Secondary | ICD-10-CM | POA: Diagnosis not present

## 2023-02-18 DIAGNOSIS — M542 Cervicalgia: Secondary | ICD-10-CM | POA: Diagnosis not present

## 2023-02-21 ENCOUNTER — Ambulatory Visit (INDEPENDENT_AMBULATORY_CARE_PROVIDER_SITE_OTHER): Payer: 59

## 2023-02-21 DIAGNOSIS — Z0000 Encounter for general adult medical examination without abnormal findings: Secondary | ICD-10-CM
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
## 2023-02-21 NOTE — Patient Instructions (Signed)
  Stephanie Elliott , Thank you for taking time to come for your Medicare Wellness Visit. I appreciate your ongoing commitment to your health goals. Please review the following plan we discussed and let me know if I can assist you in the future.   These are the goals we discussed:  Goals      Exercise 3x per week (30 min per time)     I would like to get a treadmill      Patient Stated     I would like to get my left knee replaced      Patient Stated     Organize her father's home.     Patient Stated     Get some bills paid off and things done around her house        This is a list of the screening recommended for you and due dates:  Health Maintenance  Topic Date Due   COVID-19 Vaccine (3 - Pfizer risk series) 10/16/2020   Mammogram  02/23/2023   Flu Shot  05/05/2023   Pap Smear  08/13/2023   Medicare Annual Wellness Visit  02/21/2024   Colon Cancer Screening  02/03/2026   DTaP/Tdap/Td vaccine (2 - Td or Tdap) 04/02/2032   Hepatitis C Screening: USPSTF Recommendation to screen - Ages 3-79 yo.  Completed   HIV Screening  Completed   Zoster (Shingles) Vaccine  Completed   HPV Vaccine  Aged Out

## 2023-02-28 ENCOUNTER — Other Ambulatory Visit: Payer: Self-pay | Admitting: Internal Medicine

## 2023-02-28 DIAGNOSIS — I10 Essential (primary) hypertension: Secondary | ICD-10-CM

## 2023-03-08 ENCOUNTER — Ambulatory Visit (INDEPENDENT_AMBULATORY_CARE_PROVIDER_SITE_OTHER): Payer: 59 | Admitting: Podiatry

## 2023-03-08 DIAGNOSIS — M25872 Other specified joint disorders, left ankle and foot: Secondary | ICD-10-CM

## 2023-03-08 DIAGNOSIS — G5792 Unspecified mononeuropathy of left lower limb: Secondary | ICD-10-CM

## 2023-03-08 DIAGNOSIS — M7742 Metatarsalgia, left foot: Secondary | ICD-10-CM

## 2023-03-08 DIAGNOSIS — M19072 Primary osteoarthritis, left ankle and foot: Secondary | ICD-10-CM

## 2023-03-08 MED ORDER — TRIAMCINOLONE ACETONIDE 40 MG/ML IJ SUSP
20.0000 mg | Freq: Once | INTRAMUSCULAR | Status: AC
  Administered 2023-03-08: 20 mg

## 2023-03-08 NOTE — Progress Notes (Signed)
She presents today for follow-up of her painful forefoot left.  Objective: Vital signs stable oriented x 3.  Pulses are palpable.  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  This is resulting in metatarsalgia of the left foot.  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  Plan: Injected around the first metatarsophalangeal joint today consider orthotics.

## 2023-04-05 ENCOUNTER — Ambulatory Visit: Payer: 59 | Admitting: *Deleted

## 2023-04-05 DIAGNOSIS — M7742 Metatarsalgia, left foot: Secondary | ICD-10-CM

## 2023-04-05 DIAGNOSIS — G5792 Unspecified mononeuropathy of left lower limb: Secondary | ICD-10-CM

## 2023-04-05 DIAGNOSIS — M25872 Other specified joint disorders, left ankle and foot: Secondary | ICD-10-CM

## 2023-04-05 NOTE — Progress Notes (Signed)
Patient presents today to measured for custom orthotics   Patient was scanned for 1 pair of custom orthotics with the [employer] scanner.    Financial Form signed.

## 2023-04-06 ENCOUNTER — Encounter: Payer: Self-pay | Admitting: Internal Medicine

## 2023-04-06 ENCOUNTER — Ambulatory Visit (INDEPENDENT_AMBULATORY_CARE_PROVIDER_SITE_OTHER): Payer: 59 | Admitting: Internal Medicine

## 2023-04-06 VITALS — BP 128/76 | HR 75 | Ht 63.0 in | Wt 133.6 lb

## 2023-04-06 DIAGNOSIS — E039 Hypothyroidism, unspecified: Secondary | ICD-10-CM | POA: Diagnosis not present

## 2023-04-06 DIAGNOSIS — E782 Mixed hyperlipidemia: Secondary | ICD-10-CM

## 2023-04-06 DIAGNOSIS — M7742 Metatarsalgia, left foot: Secondary | ICD-10-CM

## 2023-04-06 DIAGNOSIS — I10 Essential (primary) hypertension: Secondary | ICD-10-CM

## 2023-04-06 DIAGNOSIS — M810 Age-related osteoporosis without current pathological fracture: Secondary | ICD-10-CM | POA: Diagnosis not present

## 2023-04-06 DIAGNOSIS — R739 Hyperglycemia, unspecified: Secondary | ICD-10-CM

## 2023-04-06 DIAGNOSIS — K219 Gastro-esophageal reflux disease without esophagitis: Secondary | ICD-10-CM | POA: Diagnosis not present

## 2023-04-06 DIAGNOSIS — F411 Generalized anxiety disorder: Secondary | ICD-10-CM

## 2023-04-06 DIAGNOSIS — E559 Vitamin D deficiency, unspecified: Secondary | ICD-10-CM | POA: Diagnosis not present

## 2023-04-06 DIAGNOSIS — Z0001 Encounter for general adult medical examination with abnormal findings: Secondary | ICD-10-CM | POA: Diagnosis not present

## 2023-04-06 DIAGNOSIS — M961 Postlaminectomy syndrome, not elsewhere classified: Secondary | ICD-10-CM

## 2023-04-06 MED ORDER — FAMOTIDINE 40 MG PO TABS
40.0000 mg | ORAL_TABLET | Freq: Every day | ORAL | 3 refills | Status: DC
Start: 2023-04-06 — End: 2023-10-12

## 2023-04-06 NOTE — Assessment & Plan Note (Signed)
BP Readings from Last 1 Encounters:  04/06/23 128/76   Well controlled with telmisartan 20 mg once daily now Counseled for compliance with the medications Advised DASH diet and moderate exercise/walking, at least 150 mins/week

## 2023-04-06 NOTE — Assessment & Plan Note (Signed)

## 2023-04-06 NOTE — Assessment & Plan Note (Signed)
Has chronic low back pain S/p spinal stimulator placement Undergoing PT 

## 2023-04-06 NOTE — Assessment & Plan Note (Signed)
Did not tolerate Risedronate, has tried another bisphosphonate in the past Had started Prolia, but she did not pick it up - advised to consider it, material provided Continue calcium and vitamin D supplements

## 2023-04-06 NOTE — Patient Instructions (Signed)
Please start taking Pepcid as prescribed for 2 weeks and then as needed.  Please continue to take medications as prescribed.  Please continue to follow low salt diet and perform moderate exercise/walking at least 150 mins/week.

## 2023-04-06 NOTE — Assessment & Plan Note (Signed)
Well-controlled with Zoloft 
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
## 2023-04-06 NOTE — Assessment & Plan Note (Signed)
Intermittent nausea could be due to GERD Started Pepcid 40 mg every day Avoided PPI due to osteoporosis

## 2023-04-06 NOTE — Assessment & Plan Note (Signed)
Referred to Podiatry - planned to get orthotic

## 2023-04-06 NOTE — Assessment & Plan Note (Signed)
Lab Results  Component Value Date   TSH 0.177 (L) 10/05/2022   On Levothyroxine 125 mcg once daily now, had decreased dose after last blood tests Check TSH and free T4

## 2023-04-07 LAB — CMP14+EGFR
ALT: 14 IU/L (ref 0–32)
AST: 15 IU/L (ref 0–40)
Albumin: 4.3 g/dL (ref 3.9–4.9)
Alkaline Phosphatase: 77 IU/L (ref 44–121)
BUN/Creatinine Ratio: 10 — ABNORMAL LOW (ref 12–28)
BUN: 7 mg/dL — ABNORMAL LOW (ref 8–27)
Bilirubin Total: 0.4 mg/dL (ref 0.0–1.2)
CO2: 24 mmol/L (ref 20–29)
Calcium: 9.3 mg/dL (ref 8.7–10.3)
Chloride: 102 mmol/L (ref 96–106)
Creatinine, Ser: 0.73 mg/dL (ref 0.57–1.00)
Globulin, Total: 1.9 g/dL (ref 1.5–4.5)
Glucose: 81 mg/dL (ref 70–99)
Potassium: 4.6 mmol/L (ref 3.5–5.2)
Sodium: 139 mmol/L (ref 134–144)
Total Protein: 6.2 g/dL (ref 6.0–8.5)
eGFR: 92 mL/min/{1.73_m2} (ref >59)

## 2023-04-07 LAB — CBC WITH DIFFERENTIAL/PLATELET
Basophils Absolute: 0.1 10*3/uL (ref 0.0–0.2)
Basos: 1 %
EOS (ABSOLUTE): 0.2 10*3/uL (ref 0.0–0.4)
Eos: 4 %
Hematocrit: 45.3 % (ref 34.0–46.6)
Hemoglobin: 14.8 g/dL (ref 11.1–15.9)
Immature Grans (Abs): 0 10*3/uL (ref 0.0–0.1)
Immature Granulocytes: 0 %
Lymphocytes Absolute: 1.7 10*3/uL (ref 0.7–3.1)
Lymphs: 31 %
MCH: 30 pg (ref 26.6–33.0)
MCHC: 32.7 g/dL (ref 31.5–35.7)
MCV: 92 fL (ref 79–97)
Monocytes Absolute: 0.4 10*3/uL (ref 0.1–0.9)
Monocytes: 7 %
Neutrophils Absolute: 3 10*3/uL (ref 1.4–7.0)
Neutrophils: 57 %
Platelets: 216 10*3/uL (ref 150–450)
RBC: 4.94 x10E6/uL (ref 3.77–5.28)
RDW: 12.5 % (ref 11.7–15.4)
WBC: 5.3 10*3/uL (ref 3.4–10.8)

## 2023-04-07 LAB — LIPID PANEL
Chol/HDL Ratio: 2.7 ratio (ref 0.0–4.4)
Cholesterol, Total: 205 mg/dL — ABNORMAL HIGH (ref 100–199)
HDL: 76 mg/dL (ref >39)
LDL Chol Calc (NIH): 118 mg/dL — ABNORMAL HIGH (ref 0–99)
Triglycerides: 59 mg/dL (ref 0–149)
VLDL Cholesterol Cal: 11 mg/dL (ref 5–40)

## 2023-04-07 LAB — VITAMIN D 25 HYDROXY (VIT D DEFICIENCY, FRACTURES): Vit D, 25-Hydroxy: 83 ng/mL (ref 30.0–100.0)

## 2023-04-07 LAB — TSH+FREE T4
Free T4: 1.76 ng/dL (ref 0.82–1.77)
TSH: 0.177 u[IU]/mL — ABNORMAL LOW (ref 0.450–4.500)

## 2023-04-07 LAB — HEMOGLOBIN A1C
Est. average glucose Bld gHb Est-mCnc: 117 mg/dL
Hgb A1c MFr Bld: 5.7 % — ABNORMAL HIGH (ref 4.8–5.6)

## 2023-05-05 ENCOUNTER — Other Ambulatory Visit (HOSPITAL_COMMUNITY): Payer: Self-pay | Admitting: Obstetrics & Gynecology

## 2023-05-05 DIAGNOSIS — Z1231 Encounter for screening mammogram for malignant neoplasm of breast: Secondary | ICD-10-CM

## 2023-05-10 ENCOUNTER — Encounter: Payer: Self-pay | Admitting: Podiatry

## 2023-05-10 ENCOUNTER — Ambulatory Visit (INDEPENDENT_AMBULATORY_CARE_PROVIDER_SITE_OTHER): Payer: 59 | Admitting: Podiatry

## 2023-05-10 DIAGNOSIS — M25871 Other specified joint disorders, right ankle and foot: Secondary | ICD-10-CM | POA: Diagnosis not present

## 2023-05-10 DIAGNOSIS — M25872 Other specified joint disorders, left ankle and foot: Secondary | ICD-10-CM | POA: Diagnosis not present

## 2023-05-10 MED ORDER — TRIAMCINOLONE ACETONIDE 40 MG/ML IJ SUSP
40.0000 mg | Freq: Once | INTRAMUSCULAR | Status: AC
Start: 2023-05-10 — End: 2023-05-10
  Administered 2023-05-10: 40 mg

## 2023-05-10 NOTE — Progress Notes (Signed)
She presents today to pick up her orthotics which she is excited about.  States that the toe started hurting her again as she refers to the first metatarsophalangeal joint left foot.  She has tenderness on the medial aspect and plantar medial aspect and the lateral aspect most likely sesamoiditis and this is morsels of injury ligament inflammation and osteoarthritis of the first metatarsal phalangeal joint.  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  Plan: I injected around the joint today 10 mg Kenalog femoris Marcaine for maximal tenderness.  Tolerated procedure well follow-up with her as needed she will break in these orthotics per her information provided I will follow-up with her in a couple months

## 2023-05-12 ENCOUNTER — Encounter (HOSPITAL_COMMUNITY): Payer: Self-pay

## 2023-05-12 ENCOUNTER — Ambulatory Visit (HOSPITAL_COMMUNITY)
Admission: RE | Admit: 2023-05-12 | Discharge: 2023-05-12 | Disposition: A | Discharge status: Home / Self Care | Payer: 59 | Source: Ambulatory Visit | Attending: Obstetrics & Gynecology | Admitting: Obstetrics & Gynecology

## 2023-05-12 DIAGNOSIS — Z1231 Encounter for screening mammogram for malignant neoplasm of breast: Secondary | ICD-10-CM | POA: Diagnosis not present

## 2023-05-15 ENCOUNTER — Ambulatory Visit
Admission: EM | Admit: 2023-05-15 | Discharge: 2023-05-15 | Disposition: A | Discharge status: Home / Self Care | Payer: 59 | Attending: Internal Medicine | Admitting: Internal Medicine

## 2023-05-15 DIAGNOSIS — N390 Urinary tract infection, site not specified: Secondary | ICD-10-CM | POA: Insufficient documentation

## 2023-05-15 LAB — POCT URINALYSIS DIP (MANUAL ENTRY)
Bilirubin, UA: NEGATIVE
Glucose, UA: 100 mg/dL — AB
Ketones, POC UA: NEGATIVE mg/dL
Nitrite, UA: POSITIVE — AB
Spec Grav, UA: 1.015 (ref 1.010–1.025)
Urobilinogen, UA: 1 E.U./dL
pH, UA: 7 (ref 5.0–8.0)

## 2023-05-15 MED ORDER — CEPHALEXIN 500 MG PO CAPS: 500.0000 mg | ORAL_CAPSULE | Freq: Two times a day (BID) | ORAL | 0 refills | Status: DC
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
## 2023-05-15 NOTE — ED Triage Notes (Addendum)
Pt c/o frequent urination and states she noticed small blood clots in her urine last night.  Home interventions: AZO (around 0200)

## 2023-05-18 ENCOUNTER — Ambulatory Visit: Payer: 59 | Admitting: Family Medicine

## 2023-05-18 ENCOUNTER — Encounter: Payer: Self-pay | Admitting: Family Medicine

## 2023-05-18 VITALS — BP 115/90 | HR 81 | Ht 63.0 in | Wt 135.0 lb

## 2023-05-18 DIAGNOSIS — N390 Urinary tract infection, site not specified: Secondary | ICD-10-CM | POA: Diagnosis not present

## 2023-05-18 NOTE — Patient Instructions (Signed)

## 2023-05-18 NOTE — Assessment & Plan Note (Addendum)
Urinalysis, NuSwab and urine culture ordered- Awaiting results will follow up. May take OTC AZO for urinary pain relief. Explained to increase oral fluid intake. Drink 8 glasses of water daily.  Follow up for worsening or persistent symptoms. Patient verbalizes understanding regarding plan of care and all questions answered.  Referral placed to urology.
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
## 2023-05-19 LAB — URINALYSIS
Bilirubin, UA: NEGATIVE
Glucose, UA: NEGATIVE
Ketones, UA: NEGATIVE
Leukocytes,UA: NEGATIVE
Nitrite, UA: NEGATIVE
Protein,UA: NEGATIVE
RBC, UA: NEGATIVE
Specific Gravity, UA: 1.011 (ref 1.005–1.030)
Urobilinogen, Ur: 0.2 mg/dL (ref 0.2–1.0)
pH, UA: 7 (ref 5.0–7.5)

## 2023-05-21 LAB — NUSWAB VAGINITIS PLUS (VG+)
Candida albicans, NAA: NEGATIVE
Candida glabrata, NAA: NEGATIVE
Chlamydia trachomatis, NAA: NEGATIVE
Neisseria gonorrhoeae, NAA: NEGATIVE
Trich vag by NAA: NEGATIVE

## 2023-05-23 LAB — URINE CULTURE

## 2023-05-24 DIAGNOSIS — H35413 Lattice degeneration of retina, bilateral: Secondary | ICD-10-CM | POA: Diagnosis not present
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
## 2023-06-21 ENCOUNTER — Ambulatory Visit (INDEPENDENT_AMBULATORY_CARE_PROVIDER_SITE_OTHER): Payer: 59 | Admitting: Urology

## 2023-06-21 ENCOUNTER — Encounter: Payer: Self-pay | Admitting: Urology

## 2023-06-21 VITALS — BP 129/87 | HR 74 | Temp 98.8°F

## 2023-06-21 DIAGNOSIS — Z789 Other specified health status: Secondary | ICD-10-CM

## 2023-06-21 DIAGNOSIS — R3915 Urgency of urination: Secondary | ICD-10-CM

## 2023-06-21 DIAGNOSIS — N3941 Urge incontinence: Secondary | ICD-10-CM

## 2023-06-21 DIAGNOSIS — R310 Gross hematuria: Secondary | ICD-10-CM

## 2023-06-21 DIAGNOSIS — R3989 Other symptoms and signs involving the genitourinary system: Secondary | ICD-10-CM

## 2023-06-21 DIAGNOSIS — Z87891 Personal history of nicotine dependence: Secondary | ICD-10-CM

## 2023-06-21 DIAGNOSIS — N3289 Other specified disorders of bladder: Secondary | ICD-10-CM

## 2023-06-21 DIAGNOSIS — R399 Unspecified symptoms and signs involving the genitourinary system: Secondary | ICD-10-CM | POA: Diagnosis not present

## 2023-06-21 LAB — URINALYSIS, ROUTINE W REFLEX MICROSCOPIC
Bilirubin, UA: NEGATIVE
Glucose, UA: NEGATIVE
Ketones, UA: NEGATIVE
Nitrite, UA: NEGATIVE
Protein,UA: NEGATIVE
RBC, UA: NEGATIVE
Specific Gravity, UA: <1.005 — ABNORMAL LOW (ref 1.005–1.030)
Urobilinogen, Ur: 0.2 mg/dL (ref 0.2–1.0)
pH, UA: 7.5 (ref 5.0–7.5)

## 2023-06-21 LAB — MICROSCOPIC EXAMINATION
Bacteria, UA: NONE SEEN
RBC, Urine: NONE SEEN /HPF (ref 0–2)

## 2023-06-21 NOTE — Progress Notes (Signed)
post void residual=17

## 2023-06-23 ENCOUNTER — Encounter (INDEPENDENT_AMBULATORY_CARE_PROVIDER_SITE_OTHER): Payer: Self-pay

## 2023-06-23 LAB — CYTOLOGY, URINE

## 2023-06-24 ENCOUNTER — Telehealth: Payer: Self-pay

## 2023-06-24 NOTE — Telephone Encounter (Signed)
-----   Message from Donnita Falls sent at 06/23/2023  4:02 PM EDT ----- Please notify patient: Negative voided cytology - no malignant findings. Follow up for cystoscopy and CT urogram as planned for further evaluation. Thanks.

## 2023-06-24 NOTE — Telephone Encounter (Signed)
Patient is made aware and voiced understanding. 

## 2023-07-11 ENCOUNTER — Other Ambulatory Visit: Payer: 59 | Admitting: Urology

## 2023-07-12 ENCOUNTER — Ambulatory Visit: Payer: 59 | Admitting: Podiatry

## 2023-07-18 ENCOUNTER — Other Ambulatory Visit: Payer: Self-pay

## 2023-07-18 ENCOUNTER — Other Ambulatory Visit: Payer: Self-pay | Admitting: Internal Medicine

## 2023-07-18 DIAGNOSIS — E039 Hypothyroidism, unspecified: Secondary | ICD-10-CM

## 2023-07-19 ENCOUNTER — Ambulatory Visit (INDEPENDENT_AMBULATORY_CARE_PROVIDER_SITE_OTHER): Payer: 59 | Admitting: Podiatry

## 2023-07-19 ENCOUNTER — Encounter: Payer: Self-pay | Admitting: Podiatry

## 2023-07-19 DIAGNOSIS — M19072 Primary osteoarthritis, left ankle and foot: Secondary | ICD-10-CM

## 2023-07-19 MED ORDER — TRIAMCINOLONE ACETONIDE 40 MG/ML IJ SUSP
20.0000 mg | Freq: Once | INTRAMUSCULAR | Status: AC
Start: 2023-07-19 — End: 2023-07-19
  Administered 2023-07-19: 20 mg

## 2023-07-19 NOTE — Progress Notes (Signed)
She presents today states that she needs an injection pop of her foot.  Objective: Vital signs are stable she is alert oriented x 3.  She has pain on palpation to the sesamoidal apparatus in the first intermetatarsal space.  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  Plan: Discussed etiology pathology and surgical therapies at this point utilizing a 30-gauge needle I was able to inject 10 mg of Kenalog and local anesthetic to the point of maximal tenderness at the lateral aspect of the fibular sesamoid.  Tolerated procedure well and I will follow-up with her on as-needed basis.

## 2023-07-22 ENCOUNTER — Telehealth: Payer: Self-pay | Admitting: Internal Medicine

## 2023-07-22 NOTE — Telephone Encounter (Signed)
Handicap placard   Noted  Copied Sleeved  Original in PCP box Copy front desk folder  

## 2023-07-25 ENCOUNTER — Telehealth: Payer: Self-pay | Admitting: Internal Medicine

## 2023-07-25 ENCOUNTER — Other Ambulatory Visit: Payer: Self-pay

## 2023-07-25 DIAGNOSIS — Z889 Allergy status to unspecified drugs, medicaments and biological substances status: Secondary | ICD-10-CM

## 2023-07-25 MED ORDER — FLUTICASONE PROPIONATE 50 MCG/ACT NA SUSP: 1.0000 | Freq: Every day | NASAL | 5 refills | Status: AC

## 2023-07-25 NOTE — Telephone Encounter (Signed)
Prescription Request  07/25/2023  LOV: 04/06/2023  What is the name of the medication or equipment? fluticasone (FLONASE) 50 MCG/ACT nasal spray [[id number]]    Have you contacted your pharmacy to request a refill? Yes   Which pharmacy would you like this sent to?    [facility] - [place], [place] - [address] [place] [address] Phone: [phone] Fax: [phone]    Patient notified that their request is being sent to the clinical staff for review and that they should receive a response within 2 business days.   Please advise at [facility] [phone]

## 2023-07-25 NOTE — Telephone Encounter (Signed)
Refills sent to pharmacy. 

## 2023-07-25 NOTE — Telephone Encounter (Signed)
Called patient to pick up, patient must sign the form.

## 2023-07-28 ENCOUNTER — Ambulatory Visit (HOSPITAL_COMMUNITY)
Admission: RE | Admit: 2023-07-28 | Discharge: 2023-07-28 | Disposition: A | Discharge status: Home / Self Care | Payer: 59 | Source: Ambulatory Visit | Attending: Urology | Admitting: Urology

## 2023-07-28 DIAGNOSIS — Z87891 Personal history of nicotine dependence: Secondary | ICD-10-CM | POA: Insufficient documentation

## 2023-07-28 DIAGNOSIS — R310 Gross hematuria: Secondary | ICD-10-CM | POA: Insufficient documentation

## 2023-07-28 DIAGNOSIS — K5730 Diverticulosis of large intestine without perforation or abscess without bleeding: Secondary | ICD-10-CM | POA: Diagnosis not present

## 2023-07-28 DIAGNOSIS — R109 Unspecified abdominal pain: Secondary | ICD-10-CM | POA: Diagnosis not present

## 2023-07-28 DIAGNOSIS — R3989 Other symptoms and signs involving the genitourinary system: Secondary | ICD-10-CM | POA: Diagnosis present

## 2023-07-28 DIAGNOSIS — D1771 Benign lipomatous neoplasm of kidney: Secondary | ICD-10-CM | POA: Diagnosis not present

## 2023-07-28 LAB — POCT I-STAT CREATININE: Creatinine, Ser: 0.8 mg/dL (ref 0.44–1.00)

## 2023-07-28 MED ORDER — IOHEXOL 300 MG/ML  SOLN
100.0000 mL | Freq: Once | INTRAMUSCULAR | Status: AC | PRN
  Administered 2023-07-28: 100 mL via INTRAVENOUS

## 2023-08-02 ENCOUNTER — Ambulatory Visit: Payer: 59 | Admitting: Urology

## 2023-08-02 ENCOUNTER — Encounter: Payer: Self-pay | Admitting: Urology

## 2023-08-02 VITALS — BP 107/73 | HR 87

## 2023-08-02 DIAGNOSIS — Z09 Encounter for follow-up examination after completed treatment for conditions other than malignant neoplasm: Secondary | ICD-10-CM | POA: Diagnosis not present

## 2023-08-02 DIAGNOSIS — Z87898 Personal history of other specified conditions: Secondary | ICD-10-CM

## 2023-08-02 DIAGNOSIS — R310 Gross hematuria: Secondary | ICD-10-CM | POA: Diagnosis not present

## 2023-08-02 LAB — URINALYSIS, ROUTINE W REFLEX MICROSCOPIC
Bilirubin, UA: NEGATIVE
Glucose, UA: NEGATIVE
Ketones, UA: NEGATIVE
Leukocytes,UA: NEGATIVE
Nitrite, UA: NEGATIVE
Protein,UA: NEGATIVE
RBC, UA: NEGATIVE
Specific Gravity, UA: 1.015 (ref 1.005–1.030)
Urobilinogen, Ur: 0.2 mg/dL (ref 0.2–1.0)
pH, UA: 7 (ref 5.0–7.5)

## 2023-08-02 MED ORDER — CIPROFLOXACIN HCL 500 MG PO TABS
500.0000 mg | ORAL_TABLET | Freq: Once | ORAL | Status: AC
  Administered 2023-08-02: 500 mg via ORAL

## 2023-08-02 NOTE — Telephone Encounter (Signed)
Patient picked up form
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
## 2023-08-15 ENCOUNTER — Telehealth: Payer: Self-pay

## 2023-08-15 DIAGNOSIS — D1771 Benign lipomatous neoplasm of kidney: Secondary | ICD-10-CM

## 2023-08-15 NOTE — Telephone Encounter (Signed)
Patient is aware of NP's response.  MD recommended 1 year follow up for surveillance with [place].  Orders in

## 2023-08-15 NOTE — Telephone Encounter (Signed)
-----   Message from Donnita Falls sent at 08/15/2023  8:44 AM EST ----- Please let pt know her CT showed no acute findings. No GU stones, masses, or hydronephrosis. 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 She can follow up with Urology in 3-6 months if desired for OAB symptoms or on an as-needed basis.

## 2023-08-23 IMAGING — MG MM DIGITAL SCREENING BILAT W/ TOMO AND CAD
6 of 10 series · 6 of 30 positions shown · non-contrast
Comparison: Previous exam(s).

CLINICAL DATA: Screening.

EXAM:
DIGITAL SCREENING BILATERAL MAMMOGRAM WITH TOMOSYNTHESIS AND CAD
TECHNIQUE: Bilateral screening digital craniocaudal and mediolateral oblique
mammograms were obtained. Bilateral screening digital breast
tomosynthesis was performed. The images were evaluated with
computer-aided detection.

[L MLO synth-2D]
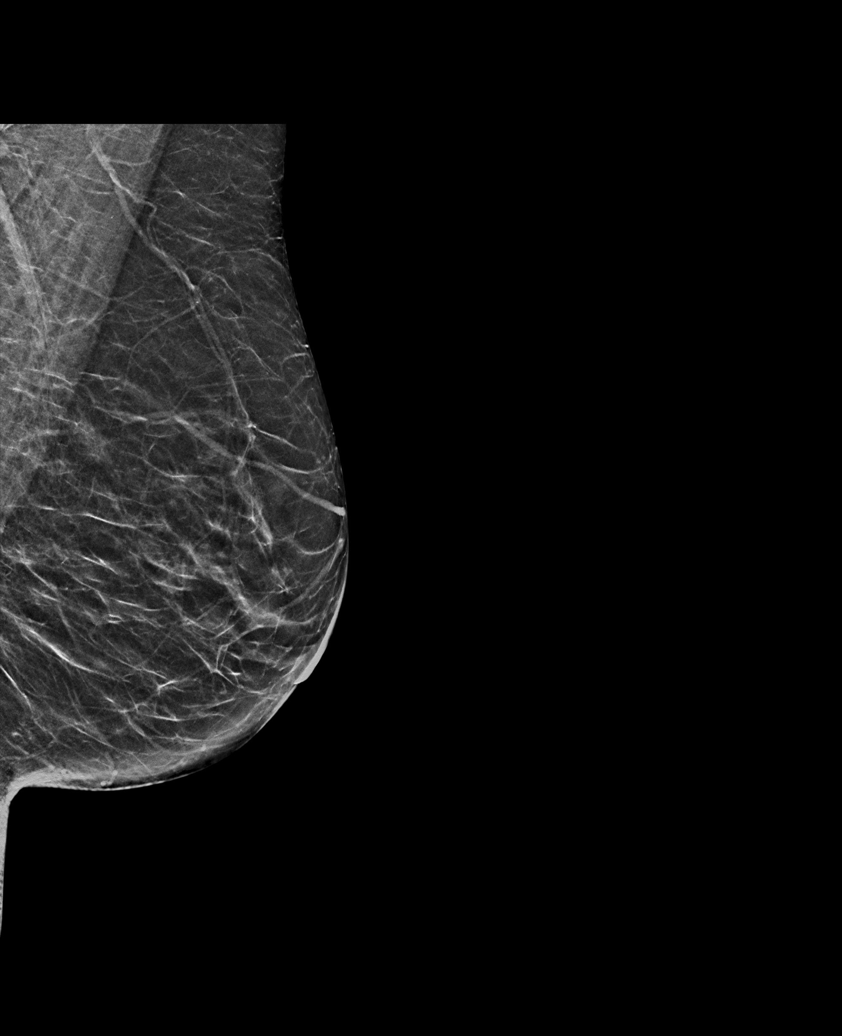

[R MLO synth-2D (1 of 2)]
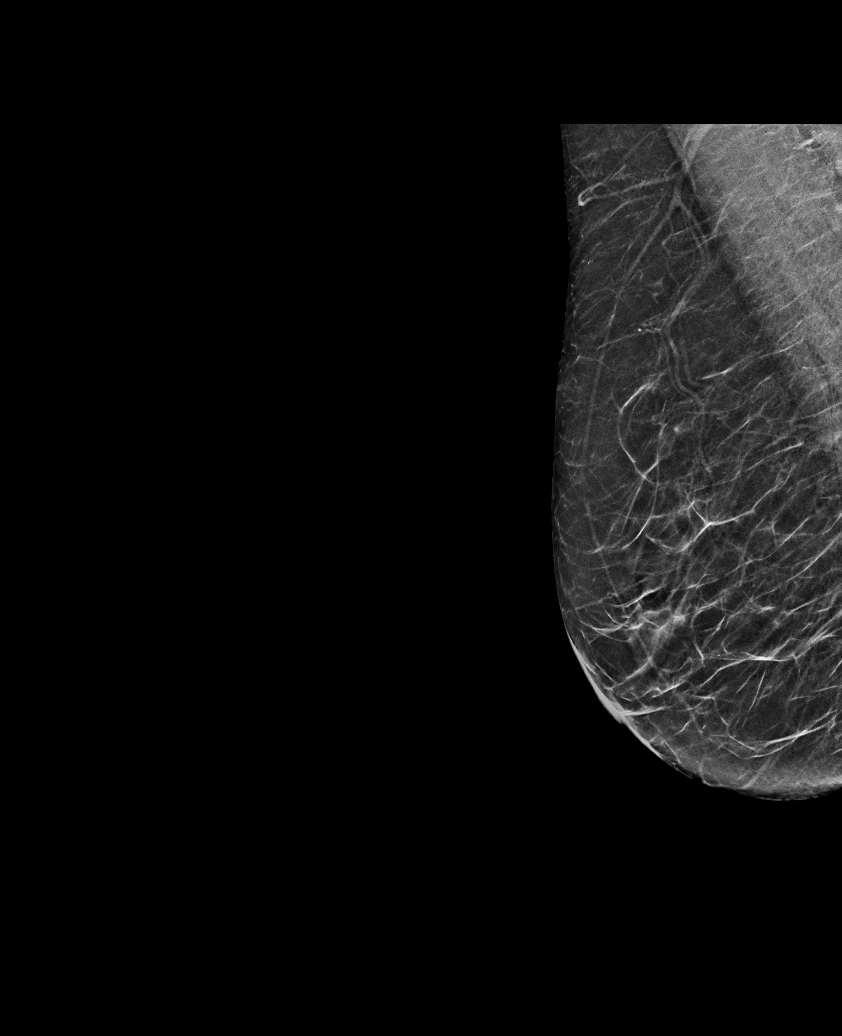

[L CC synth-2D]
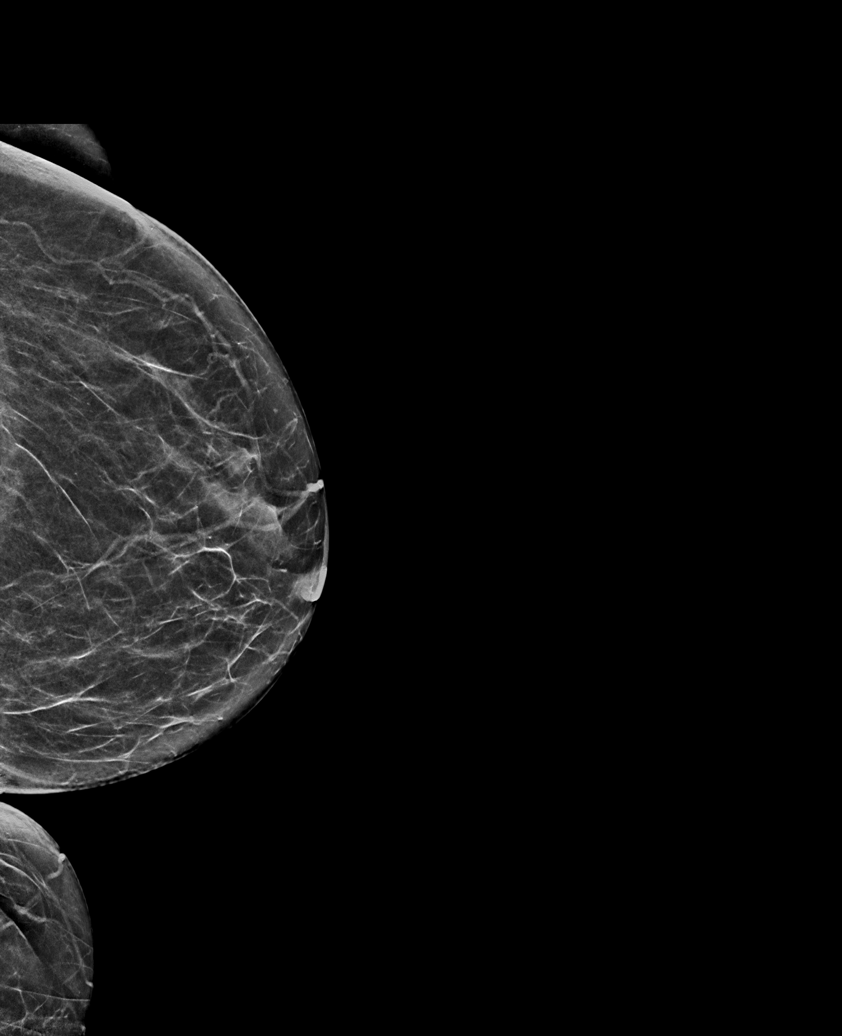

[R MLO synth-2D (2 of 2)]
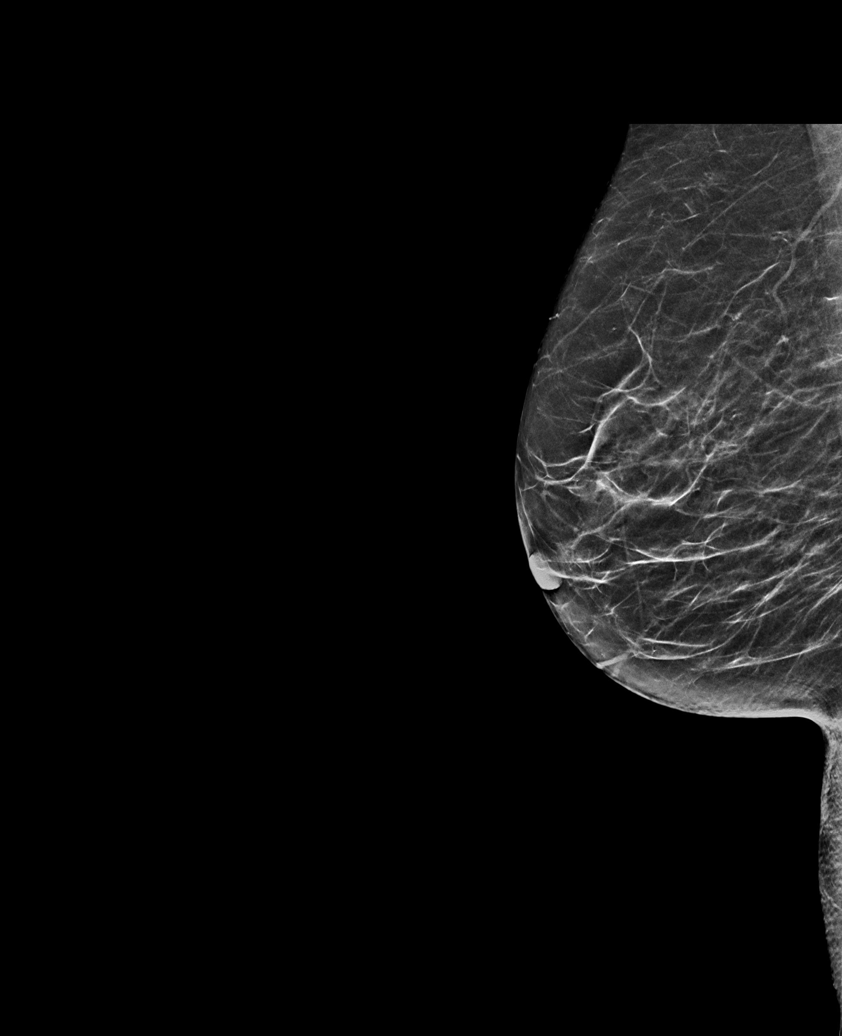

[R CC synth-2D]
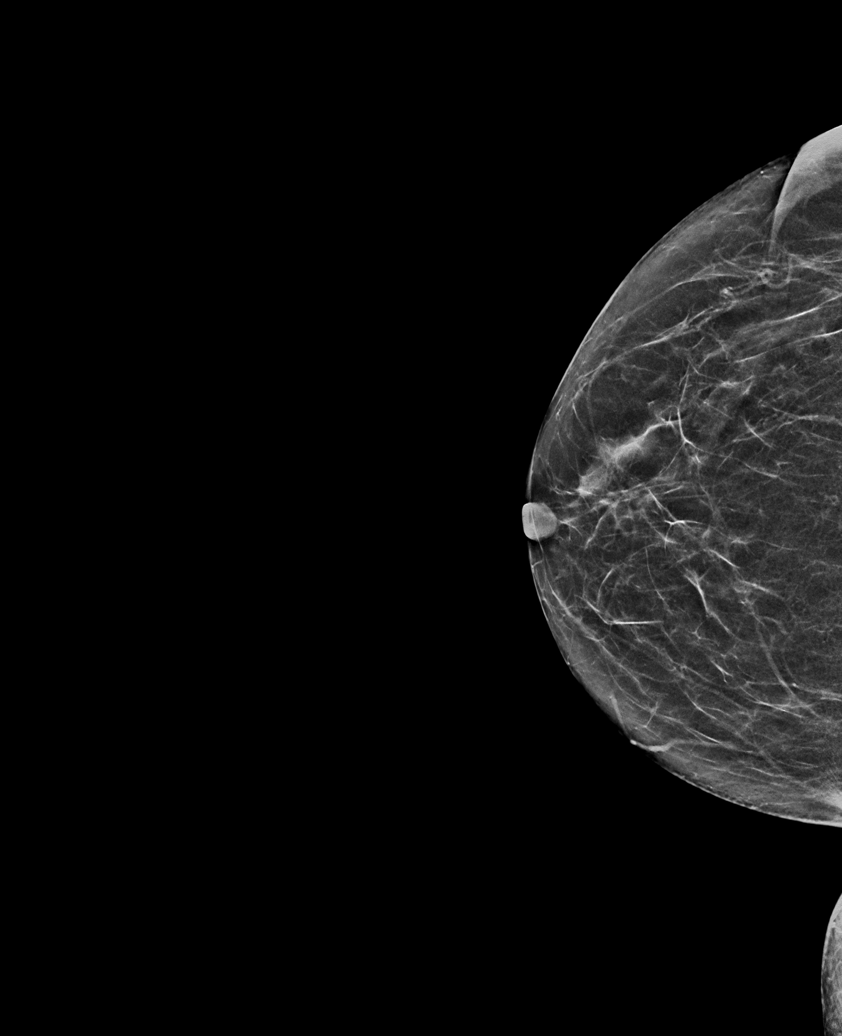

[L MLO tomo · tomo slice 25/50.0]
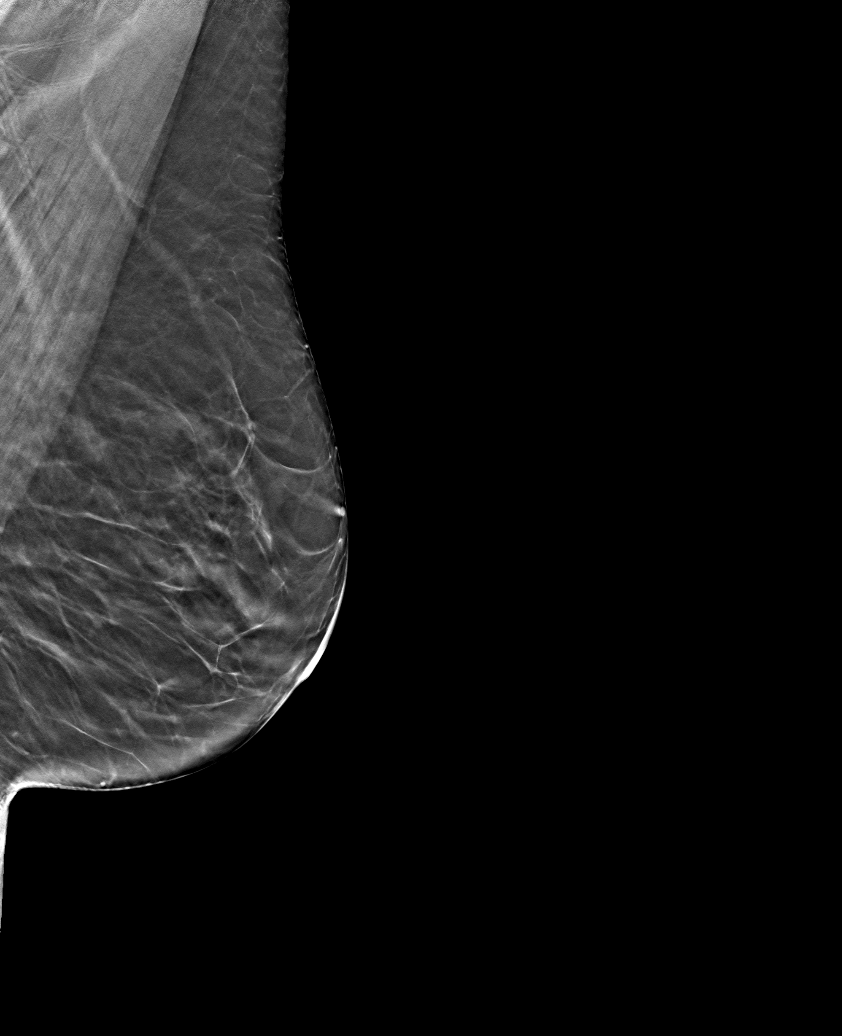

[6 of 30 positions shown; findings below may reference images not displayed]

ACR Breast Density Category b: There are scattered areas of
fibroglandular density.
FINDINGS: There are no findings suspicious for malignancy.
IMPRESSION: No mammographic evidence of malignancy. A result letter of this
screening mammogram will be mailed directly to the patient.

RECOMMENDATION:
Screening mammogram in one year. (Code:51-O-LD2)

BI-RADS CATEGORY  1: Negative.

## 2023-09-20 ENCOUNTER — Ambulatory Visit (INDEPENDENT_AMBULATORY_CARE_PROVIDER_SITE_OTHER): Payer: 59 | Admitting: Podiatry

## 2023-09-20 DIAGNOSIS — M25872 Other specified joint disorders, left ankle and foot: Secondary | ICD-10-CM | POA: Diagnosis not present

## 2023-09-20 MED ORDER — TRIAMCINOLONE ACETONIDE 40 MG/ML IJ SUSP
20.0000 mg | Freq: Once | INTRAMUSCULAR | Status: AC
  Administered 2023-09-20: 20 mg

## 2023-09-20 NOTE — Progress Notes (Signed)
She presents today for follow-up of her first metatarsal phalangeal joint sesamoiditis.  She would like to consider another injection here if at all possible.  Objective: Vital signs are stable she is alert oriented x 3.  Pulses are palpable.  There is no erythema Dem salines drainage odor she has severe pain on palpation of the fibular sesamoid left foot.  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  Plan: I injected the fibular sesamoid area today through the dorsal aspect with Kenalog and Celestone Soluspan.  Tolerated procedure well we will follow-up with her in the near future for MRI findings.  We are requesting MRI since all conservative therapies have failed to render her asymptomatic at this point this will be for differential diagnosis and surgical intervention.

## 2023-10-12 ENCOUNTER — Encounter: Payer: Self-pay | Admitting: Internal Medicine

## 2023-10-12 ENCOUNTER — Ambulatory Visit (INDEPENDENT_AMBULATORY_CARE_PROVIDER_SITE_OTHER): Payer: 59 | Admitting: Internal Medicine

## 2023-10-12 VITALS — BP 125/81 | HR 69 | Ht 63.0 in | Wt 129.4 lb

## 2023-10-12 DIAGNOSIS — I10 Essential (primary) hypertension: Secondary | ICD-10-CM | POA: Diagnosis not present

## 2023-10-12 DIAGNOSIS — E039 Hypothyroidism, unspecified: Secondary | ICD-10-CM

## 2023-10-12 DIAGNOSIS — M25872 Other specified joint disorders, left ankle and foot: Secondary | ICD-10-CM

## 2023-10-12 DIAGNOSIS — M961 Postlaminectomy syndrome, not elsewhere classified: Secondary | ICD-10-CM | POA: Diagnosis not present

## 2023-10-12 DIAGNOSIS — F411 Generalized anxiety disorder: Secondary | ICD-10-CM

## 2023-10-12 MED ORDER — PREDNISONE 20 MG PO TABS: ORAL_TABLET | ORAL | 0 refills | Status: AC

## 2023-10-12 NOTE — Assessment & Plan Note (Signed)
 BP Readings from Last 1 Encounters:  10/12/23 125/81   Well controlled with telmisartan 20 mg once daily now Counseled for compliance with the medications Advised DASH diet and moderate exercise/walking, at least 150 mins/week

## 2023-10-12 NOTE — Assessment & Plan Note (Signed)
 Followed by podiatry - has had steroid injections

## 2023-10-12 NOTE — Assessment & Plan Note (Signed)
 Well-controlled with Zoloft 50 mg QD

## 2023-10-12 NOTE — Patient Instructions (Addendum)
 Please start taking Prednisone  as prescribed.  Please take Methocarbamol  at nighttime for muscle stiffness.  Please continue to take medications as prescribed.  Please continue to follow low salt diet and perform moderate exercise/walking at least 150 mins/week.
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
## 2023-10-12 NOTE — Assessment & Plan Note (Addendum)
 Has acute on chronic low back pain Could be due to muscle strain Prednisone taper prescribed She has Robaxin - advised to take it qHS S/p spinal stimulator placement Has had PT

## 2023-10-12 NOTE — Assessment & Plan Note (Addendum)
 Lab Results  Component Value Date   TSH 0.177 (L) 04/06/2023   On Levothyroxine 125 mcg once daily now, dose was not changed as free T4 was wnl Check TSH and free T4

## 2023-10-13 ENCOUNTER — Other Ambulatory Visit: Payer: Self-pay | Admitting: Internal Medicine

## 2023-10-13 DIAGNOSIS — E039 Hypothyroidism, unspecified: Secondary | ICD-10-CM

## 2023-10-13 LAB — BASIC METABOLIC PANEL
BUN/Creatinine Ratio: 9 — ABNORMAL LOW (ref 12–28)
BUN: 7 mg/dL — ABNORMAL LOW (ref 8–27)
CO2: 25 mmol/L (ref 20–29)
Calcium: 9.7 mg/dL (ref 8.7–10.3)
Chloride: 101 mmol/L (ref 96–106)
Creatinine, Ser: 0.75 mg/dL (ref 0.57–1.00)
Glucose: 84 mg/dL (ref 70–99)
Potassium: 4.5 mmol/L (ref 3.5–5.2)
Sodium: 140 mmol/L (ref 134–144)
eGFR: 89 mL/min/{1.73_m2} (ref >59)

## 2023-10-13 LAB — TSH+FREE T4
Free T4: 1.95 ng/dL — ABNORMAL HIGH (ref 0.82–1.77)
TSH: 0.106 u[IU]/mL — ABNORMAL LOW (ref 0.450–4.500)

## 2023-10-13 MED ORDER — LEVOTHYROXINE SODIUM 100 MCG PO TABS: 100.0000 ug | ORAL_TABLET | Freq: Every day | ORAL | 1 refills | Status: DC

## 2023-10-22 ENCOUNTER — Ambulatory Visit
Admission: RE | Admit: 2023-10-22 | Discharge: 2023-10-22 | Disposition: A | Discharge status: Home / Self Care | Payer: 59 | Source: Ambulatory Visit | Attending: Podiatry | Admitting: Podiatry

## 2023-10-22 DIAGNOSIS — M79672 Pain in left foot: Secondary | ICD-10-CM | POA: Diagnosis not present

## 2023-10-22 DIAGNOSIS — R600 Localized edema: Secondary | ICD-10-CM | POA: Diagnosis not present

## 2023-10-22 DIAGNOSIS — M25872 Other specified joint disorders, left ankle and foot: Secondary | ICD-10-CM

## 2023-10-22 DIAGNOSIS — M19072 Primary osteoarthritis, left ankle and foot: Secondary | ICD-10-CM | POA: Diagnosis not present

## 2023-11-01 ENCOUNTER — Telehealth: Payer: Self-pay | Admitting: Podiatry

## 2023-11-01 NOTE — Telephone Encounter (Signed)
Lvm for pt to call to schedule appt for mri results.She is scheduled for 2/18 but I was going to see if she wants to come sooner.

## 2023-11-08 ENCOUNTER — Other Ambulatory Visit: Payer: Self-pay | Admitting: Internal Medicine

## 2023-11-08 DIAGNOSIS — I10 Essential (primary) hypertension: Secondary | ICD-10-CM

## 2023-11-22 ENCOUNTER — Ambulatory Visit (INDEPENDENT_AMBULATORY_CARE_PROVIDER_SITE_OTHER): Payer: 59 | Admitting: Podiatry

## 2023-11-22 ENCOUNTER — Encounter: Payer: Self-pay | Admitting: Podiatry

## 2023-11-22 DIAGNOSIS — M25872 Other specified joint disorders, left ankle and foot: Secondary | ICD-10-CM

## 2023-11-22 NOTE — Progress Notes (Signed)
She presents today for follow-up of her MRI of her left foot.  She states that is still hurting no better no worse.  Objective: Vital signs are stable alert oriented x 3 physical exam reveals no change in first metatarsophalangeal joint area.  It is still relatively rigid on attempted range of motion and still tender on palpation.  MRI does correlate with this consistent with osteoarthritis of the first metatarsophalangeal joint and the sesamoids.  Assessment: Osteoarthritis and bone marrow edema.  Plan: I am going to recommend that she get into a cam boot and stay in that for the next 6 weeks follow-up with her at that time we did briefly discuss surgical intervention today consisting of an implant.

## 2023-12-02 DIAGNOSIS — Z4889 Encounter for other specified surgical aftercare: Secondary | ICD-10-CM | POA: Diagnosis not present

## 2023-12-06 DIAGNOSIS — M545 Low back pain, unspecified: Secondary | ICD-10-CM | POA: Diagnosis not present

## 2023-12-12 ENCOUNTER — Ambulatory Visit
Admission: EM | Admit: 2023-12-12 | Discharge: 2023-12-12 | Disposition: A | Discharge status: Home / Self Care | Attending: Nurse Practitioner | Admitting: Nurse Practitioner

## 2023-12-12 DIAGNOSIS — J22 Unspecified acute lower respiratory infection: Secondary | ICD-10-CM

## 2023-12-12 MED ORDER — AZITHROMYCIN 250 MG PO TABS: 250.0000 mg | ORAL_TABLET | Freq: Every day | ORAL | 0 refills | Status: DC

## 2023-12-12 MED ORDER — PSEUDOEPH-BROMPHEN-DM 30-2-10 MG/5ML PO SYRP: 5.0000 mL | ORAL_SOLUTION | Freq: Four times a day (QID) | ORAL | 0 refills | Status: DC | PRN
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
## 2023-12-12 NOTE — ED Triage Notes (Signed)
 Cough, chest congestion, SOB with exertion x 5 weeks.  Taking mucinex.

## 2023-12-12 NOTE — Discharge Instructions (Signed)
 Take medication as prescribed. May take over-the-counter Tylenol or ibuprofen as needed for pain, fever, or general discomfort. Make sure you are drinking plenty of fluids. Recommend continuing to sleep elevated on pillows while cough symptoms persist. It may also be helpful for you to use a humidifier in your bedroom at nighttime during sleep and sleep elevated while symptoms persist. As discussed, your cough may last for several days to weeks.  If your cough continues to persist, but you are generally feeling well, continue over-the-counter cough medications, fluids, and cough drops.  Follow-up immediately if you experience fever, chills, wheezing, difficulty breathing, or other concerns. Follow-up as needed.

## 2023-12-21 ENCOUNTER — Other Ambulatory Visit: Payer: Self-pay | Admitting: Internal Medicine

## 2023-12-21 DIAGNOSIS — E039 Hypothyroidism, unspecified: Secondary | ICD-10-CM

## 2023-12-21 DIAGNOSIS — F419 Anxiety disorder, unspecified: Secondary | ICD-10-CM

## 2024-01-03 ENCOUNTER — Ambulatory Visit: Payer: 59 | Admitting: Podiatry

## 2024-01-09 ENCOUNTER — Encounter: Payer: Self-pay | Admitting: Obstetrics & Gynecology

## 2024-01-09 ENCOUNTER — Ambulatory Visit: Admitting: Obstetrics & Gynecology

## 2024-01-09 ENCOUNTER — Other Ambulatory Visit (HOSPITAL_COMMUNITY)
Admission: RE | Admit: 2024-01-09 | Discharge: 2024-01-09 | Disposition: A | Discharge status: Home / Self Care | Source: Ambulatory Visit | Attending: Obstetrics & Gynecology | Admitting: Obstetrics & Gynecology

## 2024-01-09 VITALS — BP 125/81 | HR 75 | Ht 63.0 in | Wt 136.0 lb

## 2024-01-09 DIAGNOSIS — Z01419 Encounter for gynecological examination (general) (routine) without abnormal findings: Secondary | ICD-10-CM | POA: Diagnosis not present

## 2024-01-09 DIAGNOSIS — Z1151 Encounter for screening for human papillomavirus (HPV): Secondary | ICD-10-CM | POA: Diagnosis not present
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
## 2024-01-10 LAB — CYTOLOGY - PAP
Comment: NEGATIVE
Diagnosis: NEGATIVE
High risk HPV: NEGATIVE

## 2024-02-14 ENCOUNTER — Ambulatory Visit (HOSPITAL_COMMUNITY)
Admission: RE | Admit: 2024-02-14 | Discharge: 2024-02-14 | Disposition: A | Discharge status: Home / Self Care | Payer: 59 | Source: Ambulatory Visit | Attending: Urology | Admitting: Urology

## 2024-02-14 DIAGNOSIS — N2889 Other specified disorders of kidney and ureter: Secondary | ICD-10-CM | POA: Insufficient documentation

## 2024-02-14 DIAGNOSIS — D1771 Benign lipomatous neoplasm of kidney: Secondary | ICD-10-CM | POA: Insufficient documentation

## 2024-03-05 ENCOUNTER — Ambulatory Visit (INDEPENDENT_AMBULATORY_CARE_PROVIDER_SITE_OTHER): Payer: 59

## 2024-03-05 VITALS — BP 122/80 | HR 77 | Resp 14 | Ht 63.0 in | Wt 136.1 lb

## 2024-03-05 DIAGNOSIS — Z0000 Encounter for general adult medical examination without abnormal findings: Secondary | ICD-10-CM | POA: Diagnosis not present
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
## 2024-03-05 NOTE — Patient Instructions (Signed)
 Stephanie Elliott , Thank you for taking time out of your busy schedule to complete your Annual Wellness Visit with me. I enjoyed our conversation and look forward to speaking with you again next year. I, as well as your care team,  appreciate your ongoing commitment to your health goals. Please review the following plan we discussed and let me know if I can assist you in the future. Your Game plan/ To Do List    Referrals: If you haven't heard from the office you've been referred to, please reach out to them at the phone number provided.  Your mammo has been scheduled on the Mobile Mammogram Bus for June 05, 2024 at 3:00 pm. The bus will be in the parking lot of [facility] at [address] [place] -Make sure to wear two-piece clothing. Make sure to bring picture ID and insurance card.  -Bring list of medications you are currently taking including any supplements.  -Remember no lotions, powders, or perfumes.   Follow up Visits: Next Medicare AWV with our clinical staff:   March 06, 2025 at 2:30 pm video visit Have you seen your provider in the last 6 months (3 months if uncontrolled diabetes)? Yes Next Office Visit with your provider:   March 13, 2024 with Dr. Lydia Sams for your physical  Clinician Recommendations:   Aim for 30 minutes of exercise or brisk walking, 6-8 glasses of water , and 5 servings of fruits and vegetables each day.  I enjoyed our conversation today and look forward to talking with you again next year!! Have a wonderful and safe year. All the best, Ty Oshima This is a list of the screening recommended for you and due dates:  Health Maintenance  Topic Date Due   Flu Shot  05/04/2024   Mammogram  05/11/2024   Medicare Annual Wellness Visit  03/05/2025   Colon Cancer Screening  02/03/2026   Pap with HPV screening  01/08/2029   DTaP/Tdap/Td vaccine (2 - Td or Tdap) 04/02/2032   COVID-19 Vaccine  Completed   Hepatitis C Screening  Completed   HIV Screening  Completed    Zoster (Shingles) Vaccine  Completed   Pneumococcal Vaccination  Aged Out   HPV Vaccine  Aged Out   Meningitis B Vaccine  Aged Out    Advanced directives: (Declined) Advance directive discussed with you today. Even though you declined this today, please call our office should you change your mind, and we can give you the proper paperwork for you to fill out. Advance Care Planning is important because it:  [x]  Makes sure you receive the medical care that is consistent with your values, goals, and preferences  [x]  It provides guidance to your family and loved ones and reduces their decisional burden about whether or not they are making the right decisions based on your wishes.  Follow the link provided in your after visit summary or read over the paperwork we have mailed to you to help you started getting your Advance Directives in place. If you need assistance in completing these, please reach out to us  so that we can help you! See attachments for Preventive Care and Fall Prevention Tips.  Understanding Your Risk for Falls Millions of people have serious injuries from falls each year. It is important to understand your risk of falling. Talk with your health care provider about your risk and what you can do to lower it. If you do have a serious fall, make sure to tell your provider. Falling  once raises your risk of falling again. How can falls affect me? Serious injuries from falls are common. These include: Broken bones, such as hip fractures. Head injuries, such as traumatic brain injuries (TBI) or concussions. A fear of falling can cause you to avoid activities and stay at home. This can make your muscles weaker and raise your risk for a fall. What can increase my risk? There are a number of risk factors that increase your risk for falling. The more risk factors you have, the higher your risk of falling. Serious injuries from a fall happen most often to people who are older than 64 years  old. Teenagers and young adults ages 41-29 are also at higher risk. Common risk factors include: Weakness in the lower body. Being generally weak or confused due to long-term (chronic) illness. Dizziness or balance problems. Poor vision. Medicines that cause dizziness or drowsiness. These may include: Medicines for your blood pressure, heart, anxiety, insomnia, or swelling (edema). Pain medicines. Muscle relaxants. Other risk factors include: Drinking alcohol. Having had a fall in the past. Having foot pain or wearing improper footwear. Working at a dangerous job. Having any of the following in your home: Tripping hazards, such as floor clutter or loose rugs. Poor lighting. Pets. Having dementia or memory loss. What actions can I take to lower my risk of falling?     Physical activity Stay physically fit. Do strength and balance exercises. Consider taking a regular class to build strength and balance. Yoga and tai chi are good options. Vision Have your eyes checked every year and your prescription for glasses or contacts updated as needed. Shoes and walking aids Wear non-skid shoes. Wear shoes that have rubber soles and low heels. Do not wear high heels. Do not walk around the house in socks or slippers. Use a cane or walker as told by your provider. Home safety Attach secure railings on both sides of your stairs. Install grab bars for your bathtub, shower, and toilet. Use a non-skid mat in your bathtub or shower. Attach bath mats securely with double-sided, non-slip rug tape. Use good lighting in all rooms. Keep a flashlight near your bed. Make sure there is a clear path from your bed to the bathroom. Use night-lights. Do not use throw rugs. Make sure all carpeting is taped or tacked down securely. Remove all clutter from walkways and stairways, including extension cords. Repair uneven or broken steps and floors. Avoid walking on icy or slippery surfaces. Walk on the grass  instead of on icy or slick sidewalks. Use ice melter to get rid of ice on walkways in the winter. Use a cordless phone. Questions to ask your health care provider Can you help me check my risk for a fall? Do any of my medicines make me more likely to fall? Should I take a vitamin D  supplement? What exercises can I do to improve my strength and balance? Should I make an appointment to have my vision checked? Do I need a bone density test to check for weak bones (osteoporosis)? Would it help to use a cane or a walker? Where to find more information Centers for Disease Control and Prevention, STEADI: TonerPromos.no Community-Based Fall Prevention Programs: TonerPromos.no [employer] on Aging: BaseRingTones.pl Contact a health care provider if: You fall at home. You are afraid of falling at home. You feel weak, drowsy, or dizzy. This information is not intended to replace advice given to you by your health care provider. Make sure you discuss any  questions you have with your health care provider. Document Revised: 05/24/2022 Document Reviewed: 05/24/2022 Elsevier Patient Education  2024 [employer].  Understanding Your Risk for Falls Millions of people have serious injuries from falls each year. It is important to understand your risk of falling. Talk with your health care provider about your risk and what you can do to lower it. If you do have a serious fall, make sure to tell your provider. Falling once raises your risk of falling again. How can falls affect me? Serious injuries from falls are common. These include: Broken bones, such as hip fractures. Head injuries, such as traumatic brain injuries (TBI) or concussions. A fear of falling can cause you to avoid activities and stay at home. This can make your muscles weaker and raise your risk for a fall. What can increase my risk? There are a number of risk factors that increase your risk for falling. The more risk factors you have, the higher your  risk of falling. Serious injuries from a fall happen most often to people who are older than 64 years old. Teenagers and young adults ages 75-29 are also at higher risk. Common risk factors include: Weakness in the lower body. Being generally weak or confused due to long-term (chronic) illness. Dizziness or balance problems. Poor vision. Medicines that cause dizziness or drowsiness. These may include: Medicines for your blood pressure, heart, anxiety, insomnia, or swelling (edema). Pain medicines. Muscle relaxants. Other risk factors include: Drinking alcohol. Having had a fall in the past. Having foot pain or wearing improper footwear. Working at a dangerous job. Having any of the following in your home: Tripping hazards, such as floor clutter or loose rugs. Poor lighting. Pets. Having dementia or memory loss. What actions can I take to lower my risk of falling?     Physical activity Stay physically fit. Do strength and balance exercises. Consider taking a regular class to build strength and balance. Yoga and tai chi are good options. Vision Have your eyes checked every year and your prescription for glasses or contacts updated as needed. Shoes and walking aids Wear non-skid shoes. Wear shoes that have rubber soles and low heels. Do not wear high heels. Do not walk around the house in socks or slippers. Use a cane or walker as told by your provider. Home safety Attach secure railings on both sides of your stairs. Install grab bars for your bathtub, shower, and toilet. Use a non-skid mat in your bathtub or shower. Attach bath mats securely with double-sided, non-slip rug tape. Use good lighting in all rooms. Keep a flashlight near your bed. Make sure there is a clear path from your bed to the bathroom. Use night-lights. Do not use throw rugs. Make sure all carpeting is taped or tacked down securely. Remove all clutter from walkways and stairways, including extension  cords. Repair uneven or broken steps and floors. Avoid walking on icy or slippery surfaces. Walk on the grass instead of on icy or slick sidewalks. Use ice melter to get rid of ice on walkways in the winter. Use a cordless phone. Questions to ask your health care provider Can you help me check my risk for a fall? Do any of my medicines make me more likely to fall? Should I take a vitamin D  supplement? What exercises can I do to improve my strength and balance? Should I make an appointment to have my vision checked? Do I need a bone density test to check for weak bones (osteoporosis)?  Would it help to use a cane or a walker? Where to find more information Centers for Disease Control and Prevention, STEADI: TonerPromos.no Community-Based Fall Prevention Programs: TonerPromos.no [employer] on Aging: BaseRingTones.pl Contact a health care provider if: You fall at home. You are afraid of falling at home. You feel weak, drowsy, or dizzy. This information is not intended to replace advice given to you by your health care provider. Make sure you discuss any questions you have with your health care provider. Document Revised: 05/24/2022 Document Reviewed: 05/24/2022 Elsevier Patient Education  2024 [employer].

## 2024-04-12 ENCOUNTER — Encounter: Payer: 59 | Admitting: Internal Medicine

## 2024-04-26 ENCOUNTER — Telehealth: Payer: Self-pay | Admitting: Pharmacist

## 2024-04-26 NOTE — Progress Notes (Signed)
 Pharmacy Quality Measure Review  This patient is appearing on a report for being at risk of failing the adherence measure for hypertension (ACEi/ARB) medications this calendar year.   Medication: telmisartan  20 mg daily Last fill date: 11/13/23 for 90 day supply  Patient reports she canceled auto refills for this medication as she has a stockpile. Reports [facility] filled it several times before she needed refills. Notes that she does take this medication every day and denies questions or concerns. Most recently documented blood pressure is at goal.   No further action needed at this time.   Catie IVAR Centers, PharmD, [facility] Clinical Pharmacist [phone]

## 2024-05-29 ENCOUNTER — Ambulatory Visit (INDEPENDENT_AMBULATORY_CARE_PROVIDER_SITE_OTHER): Admitting: Internal Medicine

## 2024-05-29 ENCOUNTER — Encounter: Payer: Self-pay | Admitting: Internal Medicine

## 2024-05-29 ENCOUNTER — Other Ambulatory Visit: Payer: Self-pay | Admitting: Internal Medicine

## 2024-05-29 VITALS — BP 106/69 | HR 87 | Ht 63.0 in | Wt 134.8 lb

## 2024-05-29 DIAGNOSIS — I10 Essential (primary) hypertension: Secondary | ICD-10-CM | POA: Diagnosis not present

## 2024-05-29 DIAGNOSIS — E782 Mixed hyperlipidemia: Secondary | ICD-10-CM | POA: Insufficient documentation

## 2024-05-29 DIAGNOSIS — E039 Hypothyroidism, unspecified: Secondary | ICD-10-CM | POA: Diagnosis not present

## 2024-05-29 DIAGNOSIS — F411 Generalized anxiety disorder: Secondary | ICD-10-CM | POA: Diagnosis not present

## 2024-05-29 DIAGNOSIS — R7303 Prediabetes: Secondary | ICD-10-CM | POA: Diagnosis not present

## 2024-05-29 DIAGNOSIS — M961 Postlaminectomy syndrome, not elsewhere classified: Secondary | ICD-10-CM

## 2024-05-29 DIAGNOSIS — Z1231 Encounter for screening mammogram for malignant neoplasm of breast: Secondary | ICD-10-CM

## 2024-05-29 DIAGNOSIS — E559 Vitamin D deficiency, unspecified: Secondary | ICD-10-CM

## 2024-05-29 NOTE — Assessment & Plan Note (Signed)
 Lab Results  Component Value Date   HGBA1C 5.7 (H) 04/06/2023   Advised to follow DASH diet

## 2024-05-29 NOTE — Assessment & Plan Note (Signed)
 Lab Results  Component Value Date   TSH 0.106 (L) 10/12/2023   On Levothyroxine  100 mcg once daily now, dose was reduced after last TSH check Check TSH and free T4

## 2024-05-29 NOTE — Assessment & Plan Note (Signed)
 Well-controlled with Zoloft 50 mg QD

## 2024-05-29 NOTE — Assessment & Plan Note (Signed)
Check lipid profile Advised to follow DASH diet 

## 2024-05-29 NOTE — Patient Instructions (Signed)
 Please continue to take medications as prescribed.  Please continue to follow low salt diet and perform moderate exercise/walking as tolerated.

## 2024-05-29 NOTE — Assessment & Plan Note (Addendum)
 BP Readings from Last 1 Encounters:  05/29/24 106/69   Well controlled with telmisartan  20 mg QD Advised to check BP at home, if SBP is less than 100, may need to reduce dose to telmisartan  10 mg QD Counseled for compliance with the medications Advised DASH diet and moderate exercise/walking, at least 150 mins/week

## 2024-05-29 NOTE — Assessment & Plan Note (Signed)
 Has chronic low back pain She has Robaxin  - advised to take it qHS S/p spinal stimulator placement Has had PT
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
## 2024-05-30 ENCOUNTER — Ambulatory Visit: Payer: Self-pay | Admitting: Internal Medicine

## 2024-05-30 LAB — CBC WITH DIFFERENTIAL/PLATELET
Basophils Absolute: 0.1 x10E3/uL (ref 0.0–0.2)
Basos: 1 %
EOS (ABSOLUTE): 0.2 x10E3/uL (ref 0.0–0.4)
Eos: 3 %
Hematocrit: 42.5 % (ref 34.0–46.6)
Hemoglobin: 14.1 g/dL (ref 11.1–15.9)
Immature Grans (Abs): 0 x10E3/uL (ref 0.0–0.1)
Immature Granulocytes: 0 %
Lymphocytes Absolute: 1.6 x10E3/uL (ref 0.7–3.1)
Lymphs: 32 %
MCH: 30.3 pg (ref 26.6–33.0)
MCHC: 33.2 g/dL (ref 31.5–35.7)
MCV: 91 fL (ref 79–97)
Monocytes Absolute: 0.5 x10E3/uL (ref 0.1–0.9)
Monocytes: 10 %
Neutrophils Absolute: 2.8 x10E3/uL (ref 1.4–7.0)
Neutrophils: 54 %
Platelets: 180 x10E3/uL (ref 150–450)
RBC: 4.65 x10E6/uL (ref 3.77–5.28)
RDW: 12.3 % (ref 11.7–15.4)
WBC: 5.2 x10E3/uL (ref 3.4–10.8)

## 2024-05-30 LAB — CMP14+EGFR
ALT: 11 IU/L (ref 0–32)
AST: 15 IU/L (ref 0–40)
Albumin: 4.4 g/dL (ref 3.9–4.9)
Alkaline Phosphatase: 76 IU/L (ref 44–121)
BUN/Creatinine Ratio: 14 (ref 12–28)
BUN: 13 mg/dL (ref 8–27)
Bilirubin Total: 0.3 mg/dL (ref 0.0–1.2)
CO2: 23 mmol/L (ref 20–29)
Calcium: 9.5 mg/dL (ref 8.7–10.3)
Chloride: 104 mmol/L (ref 96–106)
Creatinine, Ser: 0.9 mg/dL (ref 0.57–1.00)
Globulin, Total: 1.8 g/dL (ref 1.5–4.5)
Glucose: 90 mg/dL (ref 70–99)
Potassium: 4.1 mmol/L (ref 3.5–5.2)
Sodium: 140 mmol/L (ref 134–144)
Total Protein: 6.2 g/dL (ref 6.0–8.5)
eGFR: 71 mL/min/1.73 (ref >59)

## 2024-05-30 LAB — HEMOGLOBIN A1C
Est. average glucose Bld gHb Est-mCnc: 108 mg/dL
Hgb A1c MFr Bld: 5.4 % (ref 4.8–5.6)

## 2024-05-30 LAB — TSH+FREE T4
Free T4: 1.52 ng/dL (ref 0.82–1.77)
TSH: 0.634 u[IU]/mL (ref 0.450–4.500)

## 2024-05-30 LAB — LIPID PANEL
Chol/HDL Ratio: 2.9 ratio (ref 0.0–4.4)
Cholesterol, Total: 198 mg/dL (ref 100–199)
HDL: 68 mg/dL (ref >39)
LDL Chol Calc (NIH): 109 mg/dL — ABNORMAL HIGH (ref 0–99)
Triglycerides: 120 mg/dL (ref 0–149)
VLDL Cholesterol Cal: 21 mg/dL (ref 5–40)

## 2024-05-30 LAB — VITAMIN D 25 HYDROXY (VIT D DEFICIENCY, FRACTURES): Vit D, 25-Hydroxy: 80.1 ng/mL (ref 30.0–100.0)

## 2024-06-05 ENCOUNTER — Inpatient Hospital Stay: Admission: RE | Admit: 2024-06-05 | Source: Ambulatory Visit

## 2024-06-26 ENCOUNTER — Encounter

## 2024-08-15 ENCOUNTER — Ambulatory Visit: Payer: 59 | Admitting: Urology

## 2024-08-15 VITALS — BP 148/82 | HR 84

## 2024-08-15 DIAGNOSIS — N3281 Overactive bladder: Secondary | ICD-10-CM

## 2024-08-15 DIAGNOSIS — Z87440 Personal history of urinary (tract) infections: Secondary | ICD-10-CM

## 2024-08-15 DIAGNOSIS — D1771 Benign lipomatous neoplasm of kidney: Secondary | ICD-10-CM | POA: Diagnosis not present

## 2024-08-15 DIAGNOSIS — N390 Urinary tract infection, site not specified: Secondary | ICD-10-CM

## 2024-08-15 LAB — URINALYSIS, ROUTINE W REFLEX MICROSCOPIC
Bilirubin, UA: NEGATIVE
Glucose, UA: NEGATIVE
Ketones, UA: NEGATIVE
Leukocytes,UA: NEGATIVE
Nitrite, UA: NEGATIVE
Protein,UA: NEGATIVE
RBC, UA: NEGATIVE
Specific Gravity, UA: 1.01 (ref 1.005–1.030)
Urobilinogen, Ur: 0.2 mg/dL (ref 0.2–1.0)
pH, UA: 8.5 — ABNORMAL HIGH (ref 5.0–7.5)

## 2024-08-15 MED ORDER — MIRABEGRON ER 25 MG PO TB24: 25.0000 mg | ORAL_TABLET | Freq: Every day | ORAL | 11 refills | Status: AC
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
## 2024-08-23 ENCOUNTER — Encounter: Payer: Self-pay | Admitting: Urology

## 2024-08-23 NOTE — Patient Instructions (Signed)
 A Growth in the Kidney (Renal Mass): What to Know  A renal mass is an abnormal growth in the kidney. It may be found during an MRI, CT scan, or ultrasound that's done to check for other problems in the belly. Some renal masses are cancerous, or malignant, and can grow or spread quickly. Others are benign, which means they're not cancer. Renal masses include: Tumors. These may be either cancerous or benign. The most common cancerous tumor in adults is called renal cell carcinoma. In children, the most common type is Wilms tumor. The most common kidney tumors that aren't cancer include renal adenomas, oncocytomas, and angiomyolipomas (AML). Cysts. These are pockets of fluid that form on or in the kidney. What are the causes? Certain types of cancers, infections, or injuries can cause a renal mass. It's not always known what causes a cyst to form in or on the kidney. What are the signs or symptoms? Often, a renal mass or kidney cyst doesn't cause any signs or symptoms. How is this diagnosed? Your health care provider may suggest tests to diagnose the cause of your renal mass. These tests may include: A physical exam. Blood tests. Pee (urine) tests. Imaging tests, such as: CT scan. MRI. Ultrasound. Chest X-ray or bone scan. These may be done if a tumor is cancerous to see if the cancer has spread outside the kidney. Biopsy. This is when a small piece of tissue is removed from the renal mass for testing. How is this treated? Treatment is not always needed for a renal mass. Treatment will depend on the cause of the mass and if it's causing any problems or symptoms. For a cancerous renal mass, treatment options may include: Surgery. This is done to remove the tumor and any affected tissue. Chemotherapy. This uses medicines to kill cancer cells. Radiation. High-energy X-rays or gamma rays are used to kill cancer cells. Ablation. This uses extreme hot or cold temperature to kill the cancer  cells. Immunotherapy. Medicines are used to help the body's defense system (immune system) fight the cancer cells. Taking part in clinical trials. This involves trying new or experimental treatments to see if they're effective. Most kidney cysts don't need to be treated. Follow these instructions at home: What you need to do at home will depend on the cause of the mass. Follow the instructions that your provider gives you. In general: Take medicines only as told. If you were given antibiotics, take them as told. Do not stop taking them even if you start to feel better. Follow any instructions from your provider about what you can and can't do. Do not smoke, vape, or use nicotine or tobacco. Keep all follow-up visits. Your provider will need to check if your renal mass has changed or grown. Contact a health care provider if: You have flank pain, which is pain in your side or back. You have a fever. You have a loss of appetite. You have pain or swelling in your belly. You lose weight. Get help right away if: Your pain gets worse. There's blood in your pee. You can't pee. This information is not intended to replace advice given to you by your health care provider. Make sure you discuss any questions you have with your health care provider. Document Revised: 03/18/2023 Document Reviewed: 03/18/2023 Elsevier Patient Education  2025 [employer].

## 2024-09-12 ENCOUNTER — Telehealth: Payer: Self-pay

## 2024-09-12 ENCOUNTER — Other Ambulatory Visit

## 2024-09-12 ENCOUNTER — Other Ambulatory Visit: Payer: Self-pay

## 2024-09-12 DIAGNOSIS — N390 Urinary tract infection, site not specified: Secondary | ICD-10-CM

## 2024-09-12 LAB — URINALYSIS, ROUTINE W REFLEX MICROSCOPIC
Bilirubin, UA: NEGATIVE
Glucose, UA: NEGATIVE
Ketones, UA: NEGATIVE
Nitrite, UA: POSITIVE — AB
Specific Gravity, UA: <1.005 — ABNORMAL LOW (ref 1.005–1.030)
Urobilinogen, Ur: 1 mg/dL (ref 0.2–1.0)
pH, UA: 7 (ref 5.0–7.5)

## 2024-09-12 LAB — MICROSCOPIC EXAMINATION
RBC, Urine: >30 /HPF — AB (ref 0–2)
WBC, UA: >30 /HPF — AB (ref 0–5)

## 2024-09-12 MED ORDER — NITROFURANTOIN MONOHYD MACRO 100 MG PO CAPS: 100.0000 mg | ORAL_CAPSULE | Freq: Two times a day (BID) | ORAL | 0 refills | Status: AC

## 2024-09-12 NOTE — Telephone Encounter (Signed)
 Patient presents today with complaints of  burning and pressure .  UA and Culture done today.  Dr. Sherrilee  reviewed results and Macrobid 100 mg BID X 7 days .  Patient aware of MD recommendations and that we will reach out with culture results.      [id number], CMA

## 2024-09-12 NOTE — Telephone Encounter (Signed)
 Dysuria  Patient called with c/o dysuria x 3-5 days days.  Pain: burning and pressure (pt taking AZO Standard and last dose at 4 am this morning) Severity:6/10  Associated Signs and Symptoms:  Fever: yes Temp.did not check tempeture but know she had a low grade fever Chills: yes Hematuria: yes Urgency: yes Frequency: yes Hesitancy:no Incontinence: yes Nausea: yes Vomiting: no  Urologic History:  Any Recent Urologic Surgeries or Procedures:no Recurrent UTI's:yes Cystitis: no  Prostatitis:no Kidney or Bladder Stones: no Plan: Walk-in Clinic: no Appointment w/Physician: [no Lab visit scheduled for urine drop off: Yes Advice given:  Do you take on daily medications for UTI suppression No

## 2024-09-15 LAB — URINE CULTURE

## 2024-09-17 ENCOUNTER — Ambulatory Visit: Payer: Self-pay

## 2024-09-25 ENCOUNTER — Other Ambulatory Visit: Payer: Self-pay | Admitting: Internal Medicine

## 2024-09-25 DIAGNOSIS — E039 Hypothyroidism, unspecified: Secondary | ICD-10-CM

## 2024-10-02 ENCOUNTER — Other Ambulatory Visit: Payer: Self-pay | Admitting: Medical Genetics

## 2024-10-02 ENCOUNTER — Other Ambulatory Visit (HOSPITAL_COMMUNITY): Payer: Self-pay | Admitting: Internal Medicine

## 2024-10-02 ENCOUNTER — Telehealth

## 2024-10-02 DIAGNOSIS — Z1231 Encounter for screening mammogram for malignant neoplasm of breast: Secondary | ICD-10-CM

## 2024-10-02 DIAGNOSIS — J069 Acute upper respiratory infection, unspecified: Secondary | ICD-10-CM | POA: Diagnosis not present

## 2024-10-02 MED ORDER — AZITHROMYCIN 250 MG PO TABS: ORAL_TABLET | ORAL | 0 refills | Status: AC

## 2024-10-02 MED ORDER — PROMETHAZINE-DM 6.25-15 MG/5ML PO SYRP: 5.0000 mL | ORAL_SOLUTION | Freq: Four times a day (QID) | ORAL | 0 refills | Status: AC | PRN

## 2024-10-02 NOTE — Patient Instructions (Signed)

## 2024-10-02 NOTE — Progress Notes (Signed)
 " Virtual Visit Consent   Stephanie Elliott, you are scheduled for a virtual visit with a [facility] provider today. Just as with appointments in the office, your consent must be obtained to participate. Your consent will be active for this visit and any virtual visit you may have with one of our providers in the next 365 days. If you have a MyChart account, a copy of this consent can be sent to you electronically.  As this is a virtual visit, video technology does not allow for your provider to perform a traditional examination. This may limit your provider's ability to fully assess your condition. If your provider identifies any concerns that need to be evaluated in person or the need to arrange testing (such as labs, EKG, etc.), we will make arrangements to do so. Although advances in technology are sophisticated, we cannot ensure that it will always work on either your end or our end. If the connection with a video visit is poor, the visit may have to be switched to a telephone visit. With either a video or telephone visit, we are not always able to ensure that we have a secure connection.  By engaging in this virtual visit, you consent to the provision of healthcare and authorize for your insurance to be billed (if applicable) for the services provided during this visit. Depending on your insurance coverage, you may receive a charge related to this service.  I need to obtain your verbal consent now. Are you willing to proceed with your visit today? Stephanie Elliott has provided verbal consent on 10/02/2024 for a virtual visit (video or telephone). Loa Lamp, FNP  Date: 10/02/2024 11:33 AM   Virtual Visit via Video Note   I, Loa Lamp, connected with  Stephanie Elliott  ([id number], 1961-01-31) on 10/02/2024 at 11:30 AM EST by a video-enabled telemedicine application and verified that I am speaking with the correct person using two identifiers.  Location: Patient: Virtual Visit Location Patient:  Home Provider: Virtual Visit Location Provider: Home Office   I discussed the limitations of evaluation and management by telemedicine and the availability of in person appointments. The patient expressed understanding and agreed to proceed.    History of Present Illness: Stephanie Elliott is a 25 y.o. who identifies as a female who was assigned female at birth, and is being seen today for URI sx for a month, cough, fever early on, worsened 3 days ago with chills and aching, no more fever, keeps her up at night, sore throat. No wheezing or sob. Mucus is yellow. Post nasal drainage. No flu exposure. SABRA  HPI: HPI  Problems:  Patient Active Problem List   Diagnosis Date Noted   Prediabetes 05/29/2024   Mixed hyperlipidemia 05/29/2024   Sesamoiditis of left foot 10/05/2022   Pain in thoracic spine 05/03/2022   Lumbar post-laminectomy syndrome 03/03/2022   S/P lumbar fusion 10/01/2021   History of seasonal allergies 08/02/2020   Osteoporosis 07/29/2020   Essential hypertension 07/29/2020   Former cigarette smoker 09/11/2019   Unspecified osteoarthritis, unspecified site    GAD (generalized anxiety disorder)    Chronic pain syndrome    Venous insufficiency (chronic) (peripheral)    Dysphonia 10/06/2011   Hypothyroidism 10/06/2011   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  Resting comfortably  at home.  Head is normocephalic, atraumatic.  No labored breathing.  Speech is clear and coherent with logical content.  Patient is alert and oriented at baseline.    Assessment and Plan: 1. Upper respiratory tract infection, unspecified type (Primary)  Increase fluids, humidifier at night, tylenol  or ibuprofen as directed, UC as needed.   Follow Up Instructions: I discussed the assessment and treatment plan  with the patient. The patient was provided an opportunity to ask questions and all were answered. The patient agreed with the plan and demonstrated an understanding of the instructions.  A copy of instructions were sent to the patient via MyChart unless otherwise noted below.     The patient was advised to call back or seek an in-person evaluation if the symptoms worsen or if the condition fails to improve as anticipated.    Dennis Hegeman, FNP     [1]  Allergies Allergen Reactions   Watermelon Flavoring Agent (Non-Screening) Anaphylaxis   Codeine Nausea And Vomiting and Nausea Only   Cymbalta  [Duloxetine  Hcl] Rash    Rash around eyes  [2]  Current Outpatient Medications:    azithromycin  (ZITHROMAX ) 250 MG tablet, Take 2 tablets on day 1, then 1 tablet daily on days 2 through 5, Disp: 6 tablet, Rfl: 0   promethazine-dextromethorphan  (PROMETHAZINE-DM) 6.25-15 MG/5ML syrup, Take 5 mLs by mouth 4 (four) times daily as needed for up to 10 days for cough., Disp: 118 mL, Rfl: 0   b complex vitamins capsule, Take 1 capsule by mouth daily., Disp: , Rfl:    CALCIUM PO, Take 3 tablets by mouth daily., Disp: , Rfl:    Cholecalciferol (VITAMIN D ) 50 MCG (2000 UT) tablet, Take 2,000 Units by mouth daily., Disp: , Rfl:    fluticasone  (FLONASE ) 50 MCG/ACT nasal spray, Place 1 spray into both nostrils daily., Disp: 32 g, Rfl: 5   levothyroxine  (SYNTHROID ) 100 MCG tablet, TAKE 1 TABLET BY MOUTH DAILY  BEFORE BREAKFAST, Disp: 100 tablet, Rfl: 2   mirabegron  ER (MYRBETRIQ ) 25 MG TB24 tablet, Take 1 tablet (25 mg total) by mouth daily., Disp: 30 tablet, Rfl: 11   nitrofurantoin , macrocrystal-monohydrate, (MACROBID ) 100 MG capsule, Take 1 capsule (100 mg total) by mouth every 12 (twelve) hours., Disp: 14 capsule, Rfl: 0   Olopatadine HCl (PATADAY OP), Place 1 drop into both eyes 2 (two) times daily as needed (allergies)., Disp: , Rfl:    sertraline  (ZOLOFT ) 50 MG tablet, TAKE 1 TABLET BY MOUTH DAILY, Disp: 90  tablet, Rfl: 3   telmisartan  (MICARDIS ) 20 MG tablet, TAKE 1 TABLET BY MOUTH DAILY, Disp: 100 tablet, Rfl: 2  "

## 2024-10-05 ENCOUNTER — Ambulatory Visit (HOSPITAL_COMMUNITY)
Admission: RE | Admit: 2024-10-05 | Discharge: 2024-10-05 | Disposition: A | Discharge status: Home / Self Care | Source: Ambulatory Visit

## 2024-10-05 DIAGNOSIS — Z1231 Encounter for screening mammogram for malignant neoplasm of breast: Secondary | ICD-10-CM | POA: Insufficient documentation

## 2024-10-30 ENCOUNTER — Other Ambulatory Visit: Payer: Self-pay | Admitting: Internal Medicine

## 2024-10-30 DIAGNOSIS — F419 Anxiety disorder, unspecified: Secondary | ICD-10-CM

## 2024-11-07 ENCOUNTER — Other Ambulatory Visit: Payer: Self-pay | Admitting: Medical Genetics

## 2024-11-07 DIAGNOSIS — Z006 Encounter for examination for normal comparison and control in clinical research program: Secondary | ICD-10-CM

## 2024-11-27 ENCOUNTER — Ambulatory Visit: Admitting: Internal Medicine

## 2025-03-06 ENCOUNTER — Ambulatory Visit

## 2025-07-08 ENCOUNTER — Other Ambulatory Visit (HOSPITAL_COMMUNITY)

## 2025-08-21 ENCOUNTER — Ambulatory Visit: Admitting: Urology
